# Patient Record
Sex: Female | Born: 1975 | Race: White | Hispanic: No | Marital: Married | Smoking: Never smoker
Health system: Southern US, Community
[De-identification: ages and names within clinical notes are randomized; demographics above are authoritative.]

## PROBLEM LIST (undated history)

## (undated) DIAGNOSIS — G932 Benign intracranial hypertension: Secondary | ICD-10-CM

## (undated) DIAGNOSIS — K219 Gastro-esophageal reflux disease without esophagitis: Secondary | ICD-10-CM

## (undated) DIAGNOSIS — I10 Essential (primary) hypertension: Secondary | ICD-10-CM

## (undated) DIAGNOSIS — H9319 Tinnitus, unspecified ear: Secondary | ICD-10-CM

## (undated) HISTORY — PX: POLYPECTOMY: SHX149

## (undated) HISTORY — DX: Benign intracranial hypertension: G93.2

## (undated) HISTORY — PX: TONSILLECTOMY: SUR1361

## (undated) HISTORY — DX: Tinnitus, unspecified ear: H93.19

---

## 2008-05-19 ENCOUNTER — Ambulatory Visit (HOSPITAL_COMMUNITY): Admission: RE | Admit: 2008-05-19 | Discharge: 2008-05-19 | Payer: Self-pay | Admitting: Obstetrics and Gynecology

## 2008-07-15 ENCOUNTER — Encounter: Payer: Self-pay | Admitting: Interventional Radiology

## 2008-10-16 ENCOUNTER — Encounter (INDEPENDENT_AMBULATORY_CARE_PROVIDER_SITE_OTHER): Payer: Self-pay | Admitting: Obstetrics and Gynecology

## 2008-10-16 ENCOUNTER — Ambulatory Visit (HOSPITAL_COMMUNITY): Admission: RE | Admit: 2008-10-16 | Discharge: 2008-10-16 | Payer: Self-pay | Admitting: Obstetrics and Gynecology

## 2008-11-03 ENCOUNTER — Ambulatory Visit (HOSPITAL_COMMUNITY): Admission: RE | Admit: 2008-11-03 | Discharge: 2008-11-03 | Payer: Self-pay | Admitting: Otolaryngology

## 2008-11-10 ENCOUNTER — Ambulatory Visit: Payer: Self-pay | Admitting: Oncology

## 2008-11-10 LAB — CMP (CANCER CENTER ONLY)
Albumin: 4.3 g/dL (ref 3.3–5.5)
Alkaline Phosphatase: 52 U/L (ref 26–84)
BUN, Bld: 17 mg/dL (ref 7–22)
Calcium: 10.2 mg/dL (ref 8.0–10.3)
Chloride: 100 mEq/L (ref 98–108)
Glucose, Bld: 111 mg/dL (ref 73–118)
Potassium: 3.7 mEq/L (ref 3.3–4.7)

## 2008-11-10 LAB — CBC WITH DIFFERENTIAL (CANCER CENTER ONLY)
EOS%: 5.5 % (ref 0.0–7.0)
MCH: 31.5 pg (ref 26.0–34.0)
MCHC: 34.3 g/dL (ref 32.0–36.0)
MONO%: 6.3 % (ref 0.0–13.0)
NEUT#: 2.8 10*3/uL (ref 1.5–6.5)
Platelets: 162 10*3/uL (ref 145–400)
RDW: 11.7 % (ref 10.5–14.6)

## 2008-11-10 LAB — MORPHOLOGY - CHCC SATELLITE

## 2008-11-14 LAB — PLATELET ANTIBODIES, DIRECT: Platelet IgG Ab, Direct: NEGATIVE

## 2008-11-14 LAB — PLT GLYCOPROTEIN (INDIRECT) AUTOABS
Plt Glyco IIb/IIIA Autoabs: DETECTED — AB
Plt Glycoprotein Ib/IX Autoabs: NOT DETECTED

## 2009-02-01 ENCOUNTER — Encounter: Admission: RE | Admit: 2009-02-01 | Discharge: 2009-02-01 | Payer: Self-pay | Admitting: Neurology

## 2009-05-05 ENCOUNTER — Ambulatory Visit: Payer: Self-pay | Admitting: Oncology

## 2009-07-02 ENCOUNTER — Ambulatory Visit: Payer: Self-pay | Admitting: Oncology

## 2009-07-07 LAB — CBC WITH DIFFERENTIAL (CANCER CENTER ONLY)
BASO#: 0 10*3/uL (ref 0.0–0.2)
Eosinophils Absolute: 0.2 10*3/uL (ref 0.0–0.5)
HGB: 13.5 g/dL (ref 11.6–15.9)
MCV: 93 fL (ref 81–101)
MONO#: 0.4 10*3/uL (ref 0.1–0.9)
NEUT#: 3.3 10*3/uL (ref 1.5–6.5)
Platelets: 127 10*3/uL — ABNORMAL LOW (ref 145–400)
RBC: 4.29 10*6/uL (ref 3.70–5.32)
WBC: 6.2 10*3/uL (ref 3.9–10.0)

## 2009-07-07 LAB — LIPID PANEL
Cholesterol: 180 mg/dL (ref 0–200)
Total CHOL/HDL Ratio: 3.5 Ratio
Triglycerides: 70 mg/dL (ref <150)
VLDL: 14 mg/dL (ref 0–40)

## 2010-05-13 LAB — BASIC METABOLIC PANEL
BUN: 11 mg/dL (ref 6–23)
Calcium: 9 mg/dL (ref 8.4–10.5)
Calcium: 9.6 mg/dL (ref 8.4–10.5)
Creatinine, Ser: 0.75 mg/dL (ref 0.4–1.2)
GFR calc non Af Amer: >60 mL/min (ref >60)
GFR calc non Af Amer: >60 mL/min (ref >60)
Glucose, Bld: 87 mg/dL (ref 70–99)
Sodium: 138 mEq/L (ref 135–145)

## 2010-05-13 LAB — CBC
HCT: 33 % — ABNORMAL LOW (ref 36.0–46.0)
Hemoglobin: 12.3 g/dL (ref 12.0–15.0)
MCHC: 35 g/dL (ref 30.0–36.0)
Platelets: 128 10*3/uL — ABNORMAL LOW (ref 150–400)
RDW: 12.8 % (ref 11.5–15.5)
RDW: 13.1 % (ref 11.5–15.5)

## 2010-05-13 LAB — PROTIME-INR
INR: 1 (ref 0.00–1.49)
Prothrombin Time: 13.1 seconds (ref 11.6–15.2)

## 2010-05-13 LAB — HCG, SERUM, QUALITATIVE: Preg, Serum: NEGATIVE

## 2011-12-13 ENCOUNTER — Other Ambulatory Visit: Payer: Self-pay | Admitting: Gastroenterology

## 2013-12-15 ENCOUNTER — Emergency Department (HOSPITAL_COMMUNITY): Payer: BC Managed Care – PPO

## 2013-12-15 ENCOUNTER — Encounter (HOSPITAL_COMMUNITY): Payer: Self-pay | Admitting: Emergency Medicine

## 2013-12-15 ENCOUNTER — Emergency Department (HOSPITAL_COMMUNITY)
Admission: EM | Admit: 2013-12-15 | Discharge: 2013-12-15 | Disposition: A | Discharge status: Home / Self Care | Payer: BC Managed Care – PPO | Attending: Emergency Medicine | Admitting: Emergency Medicine

## 2013-12-15 DIAGNOSIS — S299XXA Unspecified injury of thorax, initial encounter: Secondary | ICD-10-CM | POA: Diagnosis not present

## 2013-12-15 DIAGNOSIS — Y9389 Activity, other specified: Secondary | ICD-10-CM | POA: Insufficient documentation

## 2013-12-15 DIAGNOSIS — Z23 Encounter for immunization: Secondary | ICD-10-CM | POA: Insufficient documentation

## 2013-12-15 DIAGNOSIS — S8002XA Contusion of left knee, initial encounter: Secondary | ICD-10-CM

## 2013-12-15 DIAGNOSIS — Y998 Other external cause status: Secondary | ICD-10-CM | POA: Insufficient documentation

## 2013-12-15 DIAGNOSIS — Z880 Allergy status to penicillin: Secondary | ICD-10-CM | POA: Insufficient documentation

## 2013-12-15 DIAGNOSIS — S00512A Abrasion of oral cavity, initial encounter: Secondary | ICD-10-CM | POA: Diagnosis not present

## 2013-12-15 DIAGNOSIS — S8990XA Unspecified injury of unspecified lower leg, initial encounter: Secondary | ICD-10-CM | POA: Diagnosis present

## 2013-12-15 DIAGNOSIS — Y92410 Unspecified street and highway as the place of occurrence of the external cause: Secondary | ICD-10-CM | POA: Insufficient documentation

## 2013-12-15 DIAGNOSIS — R0789 Other chest pain: Secondary | ICD-10-CM

## 2013-12-15 MED ORDER — IBUPROFEN 800 MG PO TABS: 800.0000 mg | ORAL_TABLET | Freq: Three times a day (TID) | ORAL | Status: AC | PRN

## 2013-12-15 MED ORDER — TETANUS-DIPHTH-ACELL PERTUSSIS 5-2.5-18.5 LF-MCG/0.5 IM SUSP
0.5000 mL | Freq: Once | INTRAMUSCULAR | Status: AC
Start: 2013-12-15 — End: 2013-12-15
  Administered 2013-12-15: 0.5 mL via INTRAMUSCULAR
  Filled 2013-12-15: qty 0.5

## 2013-12-15 MED ORDER — CYCLOBENZAPRINE HCL 10 MG PO TABS: 10.0000 mg | ORAL_TABLET | Freq: Two times a day (BID) | ORAL | Status: DC | PRN

## 2013-12-15 MED ORDER — OXYCODONE-ACETAMINOPHEN 5-325 MG PO TABS: 1.0000 | ORAL_TABLET | ORAL | Status: DC | PRN

## 2013-12-15 MED ORDER — OXYCODONE-ACETAMINOPHEN 5-325 MG PO TABS
1.0000 | ORAL_TABLET | Freq: Once | ORAL | Status: AC
  Administered 2013-12-15: 1 via ORAL
  Filled 2013-12-15: qty 1
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
## 2013-12-15 NOTE — ED Notes (Signed)
Patient transported to X-ray 
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
## 2013-12-15 NOTE — ED Notes (Signed)
Swelling and bruising noted to L knee. Pt unsure what she hit it on. Pt also has bite marks on inside of lower lip and it appears swollen.
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
## 2013-12-15 NOTE — Discharge Instructions (Signed)
Read the information below.  Use the prescribed medication as directed.  Please discuss all new medications with your pharmacist.  Do not take additional tylenol while taking the prescribed pain medication to avoid overdose.  You may return to the Emergency Department at any time for worsening condition or any new symptoms that concern you.  If there is any possibility that you might be pregnant, please let your health care provider know and discuss this with the pharmacist to ensure medication safety.  If you develop worsening chest pain, shortness of breath, fever, you pass out, or become weak or dizzy, return to the ER for a recheck.

## 2014-04-29 ENCOUNTER — Other Ambulatory Visit: Payer: Self-pay | Admitting: Obstetrics and Gynecology

## 2014-04-30 LAB — CYTOLOGY - PAP

## 2014-07-27 ENCOUNTER — Ambulatory Visit
Admission: RE | Admit: 2014-07-27 | Discharge: 2014-07-27 | Disposition: A | Discharge status: Home / Self Care | Payer: BLUE CROSS/BLUE SHIELD | Source: Ambulatory Visit | Attending: Neurology | Admitting: Neurology

## 2014-07-27 ENCOUNTER — Other Ambulatory Visit: Payer: Self-pay | Admitting: Neurology

## 2014-07-27 DIAGNOSIS — M542 Cervicalgia: Secondary | ICD-10-CM

## 2015-11-03 ENCOUNTER — Other Ambulatory Visit: Payer: Self-pay | Admitting: Obstetrics and Gynecology

## 2015-11-03 DIAGNOSIS — R928 Other abnormal and inconclusive findings on diagnostic imaging of breast: Secondary | ICD-10-CM

## 2015-11-15 ENCOUNTER — Other Ambulatory Visit: Payer: Self-pay | Admitting: Obstetrics and Gynecology

## 2015-11-15 ENCOUNTER — Ambulatory Visit
Admission: RE | Admit: 2015-11-15 | Discharge: 2015-11-15 | Disposition: A | Discharge status: Home / Self Care | Payer: BLUE CROSS/BLUE SHIELD | Source: Ambulatory Visit | Attending: Obstetrics and Gynecology | Admitting: Obstetrics and Gynecology

## 2015-11-15 DIAGNOSIS — R928 Other abnormal and inconclusive findings on diagnostic imaging of breast: Secondary | ICD-10-CM

## 2015-11-16 ENCOUNTER — Other Ambulatory Visit: Payer: Self-pay | Admitting: Obstetrics and Gynecology

## 2015-11-16 DIAGNOSIS — R928 Other abnormal and inconclusive findings on diagnostic imaging of breast: Secondary | ICD-10-CM

## 2015-11-18 ENCOUNTER — Other Ambulatory Visit: Payer: Self-pay | Admitting: Obstetrics and Gynecology

## 2015-11-18 ENCOUNTER — Ambulatory Visit
Admission: RE | Admit: 2015-11-18 | Discharge: 2015-11-18 | Disposition: A | Discharge status: Home / Self Care | Payer: BLUE CROSS/BLUE SHIELD | Source: Ambulatory Visit | Attending: Obstetrics and Gynecology | Admitting: Obstetrics and Gynecology

## 2015-11-18 DIAGNOSIS — R928 Other abnormal and inconclusive findings on diagnostic imaging of breast: Secondary | ICD-10-CM

## 2015-11-18 HISTORY — PX: BREAST BIOPSY: SHX20
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
## 2019-01-07 ENCOUNTER — Ambulatory Visit
Admission: EM | Admit: 2019-01-07 | Discharge: 2019-01-07 | Disposition: A | Discharge status: Home / Self Care | Payer: BC Managed Care – PPO | Attending: Emergency Medicine | Admitting: Emergency Medicine

## 2019-01-07 DIAGNOSIS — R21 Rash and other nonspecific skin eruption: Secondary | ICD-10-CM | POA: Diagnosis not present

## 2019-01-07 DIAGNOSIS — N10 Acute tubulo-interstitial nephritis: Secondary | ICD-10-CM | POA: Diagnosis present

## 2019-01-07 LAB — POCT URINALYSIS DIP (MANUAL ENTRY)
Glucose, UA: 100 mg/dL — AB
Nitrite, UA: NEGATIVE
Protein Ur, POC: 100 mg/dL — AB
Spec Grav, UA: >=1.03 — AB (ref 1.010–1.025)
Urobilinogen, UA: 1 E.U./dL
pH, UA: 5.5 (ref 5.0–8.0)

## 2019-01-07 MED ORDER — PREDNISONE 10 MG (21) PO TBPK: ORAL_TABLET | Freq: Every day | ORAL | 0 refills | Status: DC

## 2019-01-07 MED ORDER — CIPROFLOXACIN HCL 500 MG PO TABS: 500.0000 mg | ORAL_TABLET | Freq: Two times a day (BID) | ORAL | 0 refills | Status: DC

## 2019-01-07 NOTE — ED Provider Notes (Signed)
[facility]    CSN: [id number] Arrival date & time: 01/07/19  0848      History   Chief Complaint Chief Complaint  Patient presents with  . Urinary Tract Infection  . Rash    HPI Lauren Mullins is a 43 y.o. female resenting for UTI, rash.  States she developed UTI symptoms such as frequency, burning with urination, "bladder spasms " x 10 days ago.  Was evaluated for this by her PCP: Given Macrobid.  States symptoms have persisted, now having left low back pain.  States Azo does help: Last dose 2 days ago.  Patient denies history of recurrent UTI, pyelonephritis, renal calculi.  Patient denies pelvic, abdominal pain, fever.  No vaginal discharge, concern for STD at this time. Patient also noticed rash that has been "hopping around "for the last 4 days.  Reports that it is burning/itching, red, warm.  Patient voices concern that it may be drug reaction, though denies diffuse dermal involvement.  Patient does state that she died her daughter is here the other day, does tend to get adverse reactions to dye: No other known possible triggers.  Denies cough, shortness of breath.    History reviewed. No pertinent past medical history.  There are no active problems to display for this patient.   Past Surgical History:  Procedure Laterality Date  . CESAREAN SECTION    . POLYPECTOMY    . TONSILLECTOMY      OB History   No obstetric history on file.      Home Medications    Prior to Admission medications   Medication Sig Start Date End Date Taking? Authorizing Provider  esomeprazole (NEXIUM) 10 MG packet Take 10 mg by mouth daily before breakfast.   Yes [provider]  fexofenadine (ALLEGRA) 60 MG tablet Take 60 mg by mouth 2 (two) times daily.   Yes [provider]  ciprofloxacin (CIPRO) 500 MG tablet Take 1 tablet (500 mg total) by mouth 2 (two) times daily. 01/07/19   Hall-Potvin, [place], PA-C  ibuprofen (ADVIL,MOTRIN) 800 MG tablet Take 1 tablet  (800 mg total) by mouth every 8 (eight) hours as needed for mild pain or moderate pain. 12/15/13   Trixie DredgeWest, Emily, PA-C  predniSONE (STERAPRED UNI-PAK 21 TAB) 10 MG (21) TBPK tablet Take by mouth daily. Take steroid taper as written 01/07/19   Hall-Potvin, [place], PA-C    Family History Family History  Problem Relation Age of Onset  . Healthy Mother   . Cancer Father   . COPD Father     Social History Social History   Tobacco Use  . Smoking status: Never Smoker  . Smokeless tobacco: Never Used  Substance Use Topics  . Alcohol use: Yes  . Drug use: No     Allergies   Penicillins and Sulfa antibiotics   Review of Systems Review of Systems  Constitutional: Negative for fatigue and fever.  Respiratory: Negative for cough and shortness of breath.   Cardiovascular: Negative for chest pain and palpitations.  Gastrointestinal: Negative for constipation and diarrhea.  Genitourinary: Positive for dysuria, frequency and urgency. Negative for flank pain, hematuria, pelvic pain, vaginal bleeding, vaginal discharge and vaginal pain.  Skin: Positive for rash. Negative for wound.     Physical Exam Triage Vital Signs ED Triage Vitals [01/07/19 0856]  Enc Vitals Group     BP (!) 169/94     Pulse Rate 79     Resp 16     Temp 98.4 F (  36.9 C)     Temp Source Oral     SpO2 99 %     Weight      Height      Head Circumference      Peak Flow      Pain Score      Pain Loc      Pain Edu?      Excl. in [place]?    No data found.  Updated Vital Signs BP (!) 169/94 (BP Location: Left Arm)   Pulse 79   Temp 98.4 F (36.9 C) (Oral)   Resp 16   LMP  (LMP Unknown)   SpO2 99%   Visual Acuity Right Eye Distance:   Left Eye Distance:   Bilateral Distance:    Right Eye Near:   Left Eye Near:    Bilateral Near:     Physical Exam Constitutional:      General: She is not in acute distress.    Appearance: She is not ill-appearing.  HENT:     Head: Normocephalic and atraumatic.   Eyes:     General: No scleral icterus.    Pupils: Pupils are equal, round, and reactive to light.  Cardiovascular:     Rate and Rhythm: Normal rate.  Pulmonary:     Effort: Pulmonary effort is normal. No respiratory distress.     Breath sounds: No wheezing.  Abdominal:     General: Bowel sounds are normal.     Palpations: Abdomen is soft.     Tenderness: There is no abdominal tenderness. There is no right CVA tenderness, left CVA tenderness or guarding.  Skin:    Coloration: Skin is not jaundiced or pale.     Findings: Rash present.     Comments: Diffuse, irregular areas of nonraised erythema, warmth over right side of neck, distal upper extremities bilaterally, right forehead.  No open wound, pain.  Neurological:     Mental Status: She is alert and oriented to person, place, and time.      UC Treatments / Results  Labs (all labs ordered are listed, but only abnormal results are displayed) Labs Reviewed  POCT URINALYSIS DIP (MANUAL ENTRY) - Abnormal; Notable for the following components:      Result Value   Color, UA red (*)    Clarity, UA cloudy (*)    Glucose, UA =100 (*)    Bilirubin, UA small (*)    Ketones, POC UA trace (5) (*)    Spec Grav, UA >=1.030 (*)    Blood, UA large (*)    Protein Ur, POC =100 (*)    Leukocytes, UA Small (1+) (*)    All other components within normal limits  URINE CULTURE    EKG   Radiology No results found.  Procedures Procedures (including critical care time)  Medications Ordered in UC Medications - No data to display  Initial Impression / Assessment and Plan / UC Course  I have reviewed the triage vital signs and the nursing notes.  Pertinent labs & imaging results that were available during my care of the patient were reviewed by me and considered in my medical decision making (see chart for details).     Patient afebrile, nontoxic.  POC urine dipstick done office, reviewed by me: Positive for leukocytes, urobilinogen,  protein, large blood, trace ketones, small bilirubin - Culture pending given likely Macrobid resistant UTI, no concern for pyelonephritis.  Will start ciprofloxacin. Will start prednisone taper for rash: Patient to go home  wash all clothing, bedding, linens.  Return precautions discussed, patient verbalized understanding and is agreeable to plan. Final Clinical Impressions(s) / UC Diagnoses   Final diagnoses:  Rash  Acute pyelonephritis     Discharge Instructions     Take antibiotic as directed. Try taking with food to avoid adverse reaction such as GI upset, diarrhea, nausea. Take steroid taper as discussed: Follow-up with PCP for persistent, worsening rash. Go to ER if you develop throat/lip tightening, shortness of breath.    ED Prescriptions    Medication Sig Dispense Auth. Provider   ciprofloxacin (CIPRO) 500 MG tablet Take 1 tablet (500 mg total) by mouth 2 (two) times daily. 14 tablet Hall-Potvin, [place], PA-C   predniSONE (STERAPRED UNI-PAK 21 TAB) 10 MG (21) TBPK tablet Take by mouth daily. Take steroid taper as written 21 tablet Hall-Potvin, [place], PA-C     PDMP not reviewed this encounter.   Odette Fraction Boyd, [place] 01/07/19 [phone]

## 2019-01-07 NOTE — ED Triage Notes (Signed)
Pt presents with complaints of on and off rash all over. Patient also reports urinary frequency and hematruia  X 10 days.

## 2019-01-07 NOTE — Discharge Instructions (Signed)
Take antibiotic as directed. Try taking with food to avoid adverse reaction such as GI upset, diarrhea, nausea. Take steroid taper as discussed: Follow-up with PCP for persistent, worsening rash. Go to ER if you develop throat/lip tightening, shortness of breath.

## 2019-01-09 LAB — URINE CULTURE: Culture: >=100000 — AB

## 2019-05-26 ENCOUNTER — Other Ambulatory Visit: Payer: Self-pay | Admitting: Obstetrics and Gynecology

## 2019-05-26 DIAGNOSIS — R928 Other abnormal and inconclusive findings on diagnostic imaging of breast: Secondary | ICD-10-CM

## 2019-06-05 ENCOUNTER — Ambulatory Visit
Admission: RE | Admit: 2019-06-05 | Discharge: 2019-06-05 | Disposition: A | Discharge status: Home / Self Care | Payer: 59 | Source: Ambulatory Visit | Attending: Obstetrics and Gynecology | Admitting: Obstetrics and Gynecology

## 2019-06-05 ENCOUNTER — Other Ambulatory Visit: Payer: Self-pay

## 2019-06-05 ENCOUNTER — Ambulatory Visit
Admission: RE | Admit: 2019-06-05 | Discharge: 2019-06-05 | Disposition: A | Discharge status: Home / Self Care | Payer: BC Managed Care – PPO | Source: Ambulatory Visit | Attending: Obstetrics and Gynecology | Admitting: Obstetrics and Gynecology

## 2019-06-05 ENCOUNTER — Other Ambulatory Visit: Payer: Self-pay | Admitting: Obstetrics and Gynecology

## 2019-06-05 DIAGNOSIS — R928 Other abnormal and inconclusive findings on diagnostic imaging of breast: Secondary | ICD-10-CM

## 2019-06-05 DIAGNOSIS — N6489 Other specified disorders of breast: Secondary | ICD-10-CM

## 2019-06-11 ENCOUNTER — Inpatient Hospital Stay: Admission: RE | Admit: 2019-06-11 | Payer: 59 | Source: Ambulatory Visit

## 2019-06-13 ENCOUNTER — Other Ambulatory Visit: Payer: Self-pay

## 2019-06-13 ENCOUNTER — Ambulatory Visit
Admission: RE | Admit: 2019-06-13 | Discharge: 2019-06-13 | Disposition: A | Discharge status: Home / Self Care | Payer: 59 | Source: Ambulatory Visit | Attending: Obstetrics and Gynecology | Admitting: Obstetrics and Gynecology

## 2019-06-13 DIAGNOSIS — N6489 Other specified disorders of breast: Secondary | ICD-10-CM

## 2019-06-13 HISTORY — PX: BREAST BIOPSY: SHX20

## 2020-11-16 ENCOUNTER — Other Ambulatory Visit: Payer: Self-pay | Admitting: Obstetrics and Gynecology

## 2020-11-16 DIAGNOSIS — Z09 Encounter for follow-up examination after completed treatment for conditions other than malignant neoplasm: Secondary | ICD-10-CM

## 2020-11-16 DIAGNOSIS — N6012 Diffuse cystic mastopathy of left breast: Secondary | ICD-10-CM

## 2020-11-30 ENCOUNTER — Other Ambulatory Visit: Payer: Self-pay | Admitting: Obstetrics and Gynecology

## 2020-11-30 DIAGNOSIS — Z09 Encounter for follow-up examination after completed treatment for conditions other than malignant neoplasm: Secondary | ICD-10-CM

## 2020-11-30 DIAGNOSIS — N6489 Other specified disorders of breast: Secondary | ICD-10-CM

## 2020-12-03 ENCOUNTER — Ambulatory Visit
Admission: RE | Admit: 2020-12-03 | Discharge: 2020-12-03 | Disposition: A | Discharge status: Home / Self Care | Payer: 59 | Source: Ambulatory Visit | Attending: Obstetrics and Gynecology | Admitting: Obstetrics and Gynecology

## 2020-12-03 DIAGNOSIS — N6489 Other specified disorders of breast: Secondary | ICD-10-CM

## 2020-12-03 DIAGNOSIS — Z09 Encounter for follow-up examination after completed treatment for conditions other than malignant neoplasm: Secondary | ICD-10-CM

## 2021-03-02 ENCOUNTER — Encounter: Payer: Self-pay | Admitting: *Deleted

## 2021-03-02 ENCOUNTER — Other Ambulatory Visit: Payer: Self-pay | Admitting: *Deleted

## 2021-03-03 ENCOUNTER — Ambulatory Visit (INDEPENDENT_AMBULATORY_CARE_PROVIDER_SITE_OTHER): Payer: 59 | Admitting: Psychiatry

## 2021-03-03 ENCOUNTER — Telehealth: Payer: Self-pay | Admitting: Psychiatry

## 2021-03-03 ENCOUNTER — Encounter: Payer: Self-pay | Admitting: Psychiatry

## 2021-03-03 VITALS — BP 118/78 | HR 96 | Ht 67.5 in | Wt 171.2 lb

## 2021-03-03 DIAGNOSIS — G932 Benign intracranial hypertension: Secondary | ICD-10-CM | POA: Diagnosis not present

## 2021-03-03 MED ORDER — TOPIRAMATE 25 MG PO TABS: ORAL_TABLET | ORAL | 3 refills | Status: DC

## 2021-03-03 MED ORDER — NURTEC 75 MG PO TBDP: 75.0000 mg | ORAL_TABLET | ORAL | 3 refills | Status: DC | PRN

## 2021-03-03 NOTE — Telephone Encounter (Signed)
Sent to Dr. Groat ph # [phone] 

## 2021-03-03 NOTE — Progress Notes (Signed)
Referring:  Merri Brunette, MD [address] [place],  [place] [address]  PCP: Isabella Bowens, PA-C  Neurology was asked to evaluate [facility], a 46 year old female for a chief complaint of headaches.  Our recommendations of care will be communicated by shared medical record.    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 They are described as occipital pressure with associated blurred vision, photophobia, phonophobia, nausea, and vomiting. She currently has one headache per week. Takes Excedrin migraine as needed which does typically help reduce her headache.  The patient was diagnosed with IIH in 2010. At that time she developed headaches and pulsatile tinnitus. States she had an LP with elevated opening pressure at that time. She took Diamox and Dyazide and symptoms resolved.   Saw her PCP for her headaches last Friday and he restarted Dyazide. She thinks it may have helped reduce her headaches a little bit.  Ophthalmology exam last year was normal without papilledema.  Headache History: Onset: 2 months ago Triggers: lack of sleep, storm, coughing, sneezing Aura: blurred vision Location: occipital, down neck and shoulders Quality/Description: pressure Associated Symptoms:  Photophobia: yes  Phonophobia: yes  Nausea: yes Vomiting: yes Worse with activity?: yes Duration of headaches: up to 18 hours  Headache days per month: 4 Headache free days per month: 26  Current Treatment: Abortive Excedrin  Preventative Dyazide 50 mg daily  Prior Therapies                                 Diamox - cramping Dyazide 50 mg daily Ibuprofen Excedrin - heart racing Naratriptan - side effects Imitrex - side effects  Headache Risk Factors: Headache risk factors and/or co-morbidities (+) Neck Pain (-) Obesity   Body mass index is 26.42 kg/m. (+) History of Traumatic Brain Injury and/or Concussion  LABS: CBC    Component Value Date/Time   WBC 6.2 07/07/2009 1144   WBC 5.3 11/03/2008 0829   RBC 4.29 07/07/2009 1144   RBC 3.61 (L) 11/03/2008 0829   HGB 13.5 07/07/2009 1144   HCT 39.9 07/07/2009 1144   PLT 127 (L) 07/07/2009 1144   MCV 93 07/07/2009 1144   MCH 31.5 07/07/2009 1144   MCHC 33.9 07/07/2009 1144   MCHC 35.6 11/03/2008 0829   RDW 11.6 07/07/2009 1144   LYMPHSABS 2.3 07/07/2009 1144   EOSABS 0.2 07/07/2009 1144   BASOSABS 0.0 07/07/2009 1144   CMP Latest Ref Rng & Units 11/10/2008 11/03/2008 10/16/2008  Glucose 73 - 118 mg/dL 628 366(Q) 87  BUN 7 - 22 mg/dL 17 11 13   Creatinine 0.6 - 1.2 mg/dl 0.8 9.47  Sodium 6.54 - 145 mEq/L 138 138 138  Potassium 3.3 - 4.7 mEq/L 3.7 3.6 3.2(L)  Chloride 98 - 108 mEq/L 100 101 105  CO2 18 - 33 mEq/L 31 30 27   Calcium 8.0 - 10.3 mg/dL 650 9.0 9.6  Total Protein 6.4 - 8.1 g/dL 8.3(H) - -  Total Bilirubin 0.20 - 1.60 mg/dl - -  Alkaline Phos 26 - 84 U/L 52 - -  AST 11 - 38 U/L 25 - -  ALT 10 - 47 U/L 25 - -     IMAGING:  MRI brain 2010: 1. Attenuation of some intracranial veins as  described on the prior  cerebral angiogram, see MRV below.  In conjunction with ectasia of the optic nerve root sheaths, the appearance can be seen in the  setting of idiopathic intracranial hypertension. Clinical  correlation recommended.  2.  Otherwise, normal MRI appearance of the brain.   MRV/MRA brain 2010: 1.  No venous sinus thrombosis.  Attenuated appearance of the  transverse sinuses could be explained by a prominent occipital  sinus anatomic variant which communicates with the right internal  jugular bulb.  2.  Negative intracranial MRA.  Incidental diminutive basilar  artery due to bilateral fetal PCA origins.   Imaging independently reviewed on March 03, 2021   Current Outpatient Medications on File Prior to Visit  Medication  Sig Dispense Refill   aspirin-acetaminophen-caffeine (EXCEDRIN MIGRAINE) 250-250-65 MG tablet as needed.     Cyanocobalamin 1000 MCG CAPS See admin instructions.     esomeprazole (NEXIUM) 10 MG packet Take 10 mg by mouth daily before breakfast.     ibuprofen (ADVIL,MOTRIN) 800 MG tablet Take 1 tablet (800 mg total) by mouth every 8 (eight) hours as needed for mild pain or moderate pain. 15 tablet 0   Magnesium 200 MG TABS 2 tablets with a meal     Triamterene-HCTZ (DYAZIDE PO) Take 50 mg by mouth daily.     No current facility-administered medications on file prior to visit.     Allergies: Allergies  Allergen Reactions   Macrobid [Nitrofurantoin] Hives    Other reaction(s): rash   Penicillins     rash   Sulfa Antibiotics     Family History: Migraine or other headaches in the family:  no Aneurysms in a first degree relative:  no Brain tumors in the family:  no Other neurological illness in the family:   no  Past Medical History: Past Medical History:  Diagnosis Date   Intracranial hypertension    Tinnitus     Past Surgical History Past Surgical History:  Procedure Laterality Date   BREAST BIOPSY Left 06/13/2019   BREAST BIOPSY Left 11/18/2015   CESAREAN SECTION     POLYPECTOMY     TONSILLECTOMY      Social History: Social History   Tobacco Use   Smoking status: Never   Smokeless tobacco: Never  Substance Use Topics   Alcohol use: Yes    Comment: occas   Drug use: No    ROS: Negative for fevers, chills. Positive for headaches, blurred vision. All other systems reviewed and negative unless stated otherwise in HPI.   Physical Exam:   Vital Signs: BP 118/78    Pulse 96    Ht 5' 7.5" (1.715 m)    Wt 171 lb 3.2 oz (77.7 kg)    BMI 26.42 kg/m  GENERAL: well appearing,in no acute distress,alert SKIN:  Color, texture, turgor normal. No rashes or lesions HEAD:  Normocephalic/atraumatic. CV:  RRR RESP: Normal respiratory effort MSK: +mild tenderness to  palpation over bilateral occiput, neck, and shoulders  NEUROLOGICAL: Mental Status: Alert, oriented to person, place and time,Follows commands Cranial Nerves: PERRL,optic disc sharp OD, ?mild blurring of disc margins OS, visual fields intact to confrontation,extraocular movements intact,facial sensation intact,no facial droop or ptosis,hearing intact to finger rub bilaterally,no dysarthria Motor: muscle strength 5/5 both upper and lower extremities,no drift, normal tone Reflexes: 2+ throughout Sensation: intact to light touch all 4 extremities Coordination: Finger-to- nose-finger intact bilaterally,Heel-to-shin intact bilaterally Gait: normal-based   IMPRESSION: 46 year old female with a history of hiatal hernia, IIH (dx  2010 via LP), iron deficiency anemia who presents for evaluation of worsening headaches. Fundoscopic exam reveals possible subtle blurring of OS disc margin. Will refer to ophthalmology for a formal fundus exam to assess for papilledema. In the meantime will switch Dyazide to Topamax as this may be more effective for the migrainous components of her headaches. Will start Nurtec as needed for her more migrainous headaches.  PLAN: -Prevention: Stop Dyazide, start Topamax 25 mg once daily; increase dose by 25 mg each week as tolerated to 100 mg QHS -Rescue: Start Nurtec 75 mg PRN -Referral to Ophthalmology -next steps: MRI/MRV and repeat LP if papilledema found on ophthalmology exam  I spent a total of 40 minutes chart reviewing and counseling the patient.  Discussed medication side effects, adverse reactions and drug interactions. Written educational materials and patient instructions outlining all of the above were given.  Follow-up: 3 months   Ocie Doyne, MD 03/03/2021   3:02 PM

## 2021-03-03 NOTE — Patient Instructions (Addendum)
Start Topamax for headache prevention. Take 25 mg (1 pill) at bedtime for one week, then increase to 50 mg (2 pills) at bedtime for one week, then take 75 mg (3 pills) at bedtime for one week, then take 100 mg (4 pills) at bedtime Take Nurtec as needed for headaches. Take one pill at the first sign of headache, may repeat a dose in 24 hours if headache persists. Referral to ophthalmology

## 2021-03-07 ENCOUNTER — Telehealth: Payer: Self-pay

## 2021-03-07 NOTE — Telephone Encounter (Signed)
Nurtec approved , 8 per month through 03/07/2022. Approval letter faxed to pharmacy.

## 2021-03-07 NOTE — Telephone Encounter (Signed)
PA for nurtec has been verbally submitted through [facility]. Ref #- [id number]. Additional clincial information is needed and has been faxe to # [phone], confirmation has been received.  PA determination should be made within 48-72 business hours.

## 2021-03-29 ENCOUNTER — Encounter: Payer: Self-pay | Admitting: Psychiatry

## 2021-06-02 ENCOUNTER — Ambulatory Visit: Payer: 59 | Admitting: Psychiatry

## 2021-09-26 ENCOUNTER — Encounter: Payer: Self-pay | Admitting: Psychiatry

## 2021-09-27 NOTE — Telephone Encounter (Signed)
Please have her schedule a follow up with an NP to discuss medication options. Thanks!

## 2021-09-29 ENCOUNTER — Ambulatory Visit (INDEPENDENT_AMBULATORY_CARE_PROVIDER_SITE_OTHER): Payer: 59 | Admitting: Family Medicine

## 2021-09-29 ENCOUNTER — Encounter: Payer: Self-pay | Admitting: Family Medicine

## 2021-09-29 VITALS — BP 139/99 | HR 86 | Ht 67.5 in | Wt 181.0 lb

## 2021-09-29 DIAGNOSIS — R519 Headache, unspecified: Secondary | ICD-10-CM

## 2021-09-29 DIAGNOSIS — G8929 Other chronic pain: Secondary | ICD-10-CM

## 2021-09-29 DIAGNOSIS — Z8669 Personal history of other diseases of the nervous system and sense organs: Secondary | ICD-10-CM

## 2021-09-29 MED ORDER — NURTEC 75 MG PO TBDP: 75.0000 mg | ORAL_TABLET | ORAL | 3 refills | Status: DC | PRN

## 2021-09-29 NOTE — Patient Instructions (Signed)
Below is our plan:  We will try Nurtec for abortive therapy. We will discuss case with Dr Delena Bali. May consider repeat LP and or MRV. Will consider acetazolimide for treatment pending discussion.   Please make sure you are staying well hydrated. I recommend 50-60 ounces daily. Well balanced diet and regular exercise encouraged. Consistent sleep schedule with 6-8 hours recommended.   Please continue follow up with care team as directed.   Follow up with me in 4-6  You may receive a survey regarding today's visit. I encourage you to leave honest feed back as I do use this information to improve patient care. Thank you for seeing me today!

## 2021-09-29 NOTE — Progress Notes (Signed)
Chief Complaint  Patient presents with   Rm 18    Here  for headache follow-up. Wakes up with daily headaches.They have been more frequent since she saw Dr Billey Gosling. Topiramate gave her terrible side effects and she stopped it. Her insurance wouldn't cover Nurtec. She uses Excedrin migraine.    HISTORY OF PRESENT ILLNESS:  09/29/21 ALL:  Lauren Mullins is a 46 y.o. female here today for follow up for IIH and migraines. She was seen in consult with Dr Billey Gosling 02/2021. She had recently been restarted on Dyazide by PCP. Dr Billey Gosling advised discontinuation and started topiramate. Per ophthalmology notes reviewed by Dr Billey Gosling, no papilledema but she was not tolerating topiramate and advised to taper off. She reports daily headaches. She reports pain starts in the back of her head. It usually radiates to bilateral shoulders and wraps around the top of head. She has pulsating tinnitus with severe headaches. She reports migrainous symptoms about twice a month. She is endorses nausea, sound and light sensitivity. She uses Excedrin for abortive therapy, usually once a week, but recently using daily.   She was seen by ophthalmology, yesterday, and told optic nerve pressures were normal. Last LP was in 2010 and she reports elevated reading but unsure of specific result.   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 They are described as occipital pressure with associated blurred vision, photophobia, phonophobia, nausea, and vomiting. She currently has one headache per week. Takes Excedrin migraine as needed which does typically help reduce her headache.   The patient was diagnosed with IIH in 2010. At that time she developed headaches and pulsatile tinnitus. States she had an LP with elevated opening pressure at that time. She took  Diamox and Dyazide and symptoms resolved.    Saw her PCP for her headaches last Friday and he restarted Dyazide. She thinks it may have helped reduce her headaches a little bit.   Ophthalmology exam last year was normal without papilledema.   Headache History: Onset: 2 months ago Triggers: lack of sleep, storm, coughing, sneezing Aura: blurred vision Location: occipital, down neck and shoulders Quality/Description: pressure Associated Symptoms:             Photophobia: yes             Phonophobia: yes             Nausea: yes Vomiting: yes Worse with activity?: yes Duration of headaches: up to 18 hours   Headache days per month: 4 Headache free days per month: 26   Current Treatment: Abortive Excedrin   Preventative Dyazide 50 mg daily   Prior Therapies                                 Diamox - cramping Dyazide 50 mg daily Ibuprofen Excedrin - heart racing Naratriptan - side effects Imitrex - side effects   Headache Risk Factors: Headache risk factors and/or co-morbidities (+) Neck Pain (-) Obesity  Body mass index is 26.42 kg/m. (+) History of Traumatic Brain Injury and/or Concussion   REVIEW OF SYSTEMS: Out of a complete 14 system review of symptoms, the patient complains only of the following symptoms, headaches, neck pain and all other reviewed systems are negative.   ALLERGIES: Allergies  Allergen Reactions   Macrobid [Nitrofurantoin] Hives    Other reaction(s): rash   Penicillins     rash   Sulfa Antibiotics      HOME MEDICATIONS: Outpatient Medications Prior to Visit  Medication Sig Dispense Refill   aspirin-acetaminophen-caffeine (EXCEDRIN MIGRAINE) 250-250-65 MG tablet as needed.     Cyanocobalamin 1000 MCG CAPS See admin instructions.     esomeprazole (NEXIUM) 10 MG packet Take 10 mg by mouth daily before breakfast.     ibuprofen (ADVIL,MOTRIN) 800 MG tablet Take 1 tablet (800 mg total) by mouth every 8 (eight) hours as needed for mild pain or  moderate pain. 15 tablet 0   Magnesium 200 MG TABS 2 tablets with a meal     Rimegepant Sulfate (NURTEC) 75 MG TBDP Take 75 mg by mouth as needed. (Patient not taking: Reported on 09/29/2021) 8 tablet 3   topiramate (TOPAMAX) 25 MG tablet Take 25 mg (1 pill) at bedtime for one week, then increase to 50 mg (2 pills) at bedtime for one week, then take 75 mg (3 pills) at bedtime for one week, then take 100 mg (4 pills) at bedtime (Patient not taking: Reported on 09/29/2021) 120 tablet 3   Triamterene-HCTZ (DYAZIDE PO) Take 50 mg by mouth daily. (Patient not taking: Reported on 09/29/2021)     No facility-administered medications prior to visit.     PAST MEDICAL HISTORY: Past Medical History:  Diagnosis Date   Intracranial hypertension    Tinnitus      PAST SURGICAL HISTORY: Past Surgical History:  Procedure Laterality Date   BREAST BIOPSY Left 06/13/2019   BREAST BIOPSY Left 11/18/2015   CESAREAN SECTION     POLYPECTOMY     TONSILLECTOMY       FAMILY HISTORY: Family History  Problem Relation Age of Onset   Healthy Mother    Cancer Father    COPD Father    Breast cancer Maternal Aunt      SOCIAL HISTORY: Social History   Socioeconomic History   Marital status: Married    Spouse name: Richardson Landry   Number of children: 3   Years of education: Not on file   Highest education level: Some college, no degree  Occupational History    Comment: Glass blower/designer, [employer]  Tobacco Use   Smoking status: Never   Smokeless tobacco: Never  Substance and Sexual Activity   Alcohol use: Yes    Comment: occas   Drug use: No   Sexual activity: Not on file  Other Topics Concern   Not on file  Social History Narrative   Lives with family   Caffeine- coffee 1 c, soda 8 oz daily   Social Determinants of Health   Financial Resource Strain: Not on file  Food Insecurity: Not on file  Transportation Needs: Not on file  Physical Activity: Not on file  Stress: Not on file   Social Connections: Not on file  Intimate Partner Violence: Not on file     PHYSICAL EXAM  Vitals:   09/29/21 1447  BP: (!) 139/99  Pulse: 86  Weight: 181 lb (82.1 kg)  Height: 5' 7.5" (1.715 m)   Body mass index is 27.93 kg/m.  Generalized: Well developed, in no acute distress  Cardiology: normal rate and rhythm, no murmur auscultated  Respiratory: clear to auscultation bilaterally    Neurological examination  Mentation: Alert oriented to time, place, history taking. Follows all commands speech and language fluent Cranial nerve II-XII: Pupils were equal  round reactive to light. Extraocular movements were full, visual field were full on confrontational test. Facial sensation and strength were normal. Uvula tongue midline. Head turning and shoulder shrug  were normal and symmetric. Motor: The motor testing reveals 5 over 5 strength of all 4 extremities. Good symmetric motor tone is noted throughout.  Sensory: Sensory testing is intact to soft touch on all 4 extremities. No evidence of extinction is noted.  Coordination: Cerebellar testing reveals good finger-nose-finger and heel-to-shin bilaterally.  Gait and station: Gait is normal. Tandem gait is normal. Romberg is negative. No drift is seen.  Reflexes: Deep tendon reflexes are symmetric and normal bilaterally.    DIAGNOSTIC DATA (LABS, IMAGING, TESTING) - I reviewed patient records, labs, notes, testing and imaging myself where available.  Lab Results  Component Value Date   WBC 6.2 07/07/2009   HGB 13.5 07/07/2009   HCT 39.9 07/07/2009   MCV 93 07/07/2009   PLT 127 (L) 07/07/2009      Component Value Date/Time   NA 138 11/10/2008 1027   K 3.7 11/10/2008 1027   CL 100 11/10/2008 1027   CO2 31 11/10/2008 1027   GLUCOSE 111 11/10/2008 1027   BUN 17 11/10/2008 1027   CREATININE 0.8 11/10/2008 1027   CALCIUM 10.2 11/10/2008 1027   PROT 8.3 (H) 11/10/2008 1027   ALBUMIN 4.3 11/10/2008 1027   AST 25 11/10/2008  1027   ALT 25 11/10/2008 1027   ALKPHOS 52 11/10/2008 1027   BILITOT 1.20 11/10/2008 1027   GFRNONAA >60 11/03/2008 0829   GFRAA  11/03/2008 0829    >60        The eGFR has been calculated using the MDRD equation. This calculation has not been validated in all clinical situations. eGFR's persistently <60 mL/min signify possible Chronic Kidney Disease.   Lab Results  Component Value Date   CHOL 180 07/07/2009   HDL 51 07/07/2009   LDLCALC 115 (H) 07/07/2009   TRIG 70 07/07/2009   CHOLHDL 3.5 07/07/2009   No results found for: "HGBA1C" No results found for: "VITAMINB12" No results found for: "TSH"      No data to display               No data to display           ASSESSMENT AND PLAN  46 y.o. year old female  has a past medical history of Intracranial hypertension and Tinnitus. here with    Chronic intractable headache, unspecified headache type  History of idiopathic intracranial hypertension  Genesys Nannie Starzyk reports headaches continue on near daily basis. She has failed topiramate and Dyazide. We have discussed repeat imaging versus repeat LP. She was previously managed with acetazolamide and wishes to consider restarting a low dose to see if this helps. I will discuss with Dr Billey Gosling in the absence of papilledema. I will resubmit Nurtec rx. She will try taking 21m daily as needed. Advised to try to avoid regular use of Excedrin. Healthy lifestyle habits encouraged. She will follow up with PCP as directed. She will return to see me in 6 months, sooner if needed. She verbalizes understanding and agreement with this plan.   No orders of the defined types were placed in this encounter.    Meds ordered this encounter  Medications   Rimegepant Sulfate (NURTEC) 75 MG TBDP    Sig: Take 75 mg by mouth as needed.    Dispense:  8 tablet    Refill:  3  Order Specific Question:   Supervising Provider    Answer:   Melvenia Beam [[id number]]     Debbora Presto,  MSN, FNP-C 09/29/2021, 3:36 PM  [facility] [address], [place], [place] [address] [phone]

## 2021-10-03 ENCOUNTER — Encounter: Payer: Self-pay | Admitting: Family Medicine

## 2021-10-04 ENCOUNTER — Other Ambulatory Visit: Payer: Self-pay | Admitting: Family Medicine

## 2021-10-04 MED ORDER — ACETAZOLAMIDE 250 MG PO TABS: 250.0000 mg | ORAL_TABLET | Freq: Two times a day (BID) | ORAL | 1 refills | Status: DC

## 2021-11-25 ENCOUNTER — Other Ambulatory Visit: Payer: Self-pay | Admitting: Obstetrics and Gynecology

## 2021-11-25 DIAGNOSIS — Z139 Encounter for screening, unspecified: Secondary | ICD-10-CM

## 2021-12-05 ENCOUNTER — Ambulatory Visit (INDEPENDENT_AMBULATORY_CARE_PROVIDER_SITE_OTHER): Payer: 59 | Admitting: Psychiatry

## 2021-12-05 ENCOUNTER — Telehealth: Payer: Self-pay | Admitting: Psychiatry

## 2021-12-05 VITALS — BP 166/92 | HR 86 | Ht 67.0 in | Wt 185.0 lb

## 2021-12-05 DIAGNOSIS — G43019 Migraine without aura, intractable, without status migrainosus: Secondary | ICD-10-CM

## 2021-12-05 DIAGNOSIS — H93A9 Pulsatile tinnitus, unspecified ear: Secondary | ICD-10-CM | POA: Diagnosis not present

## 2021-12-05 MED ORDER — UBRELVY 50 MG PO TABS: 50.0000 mg | ORAL_TABLET | ORAL | 6 refills | Status: DC | PRN

## 2021-12-05 NOTE — Patient Instructions (Signed)
Start Lauren Mullins as needed for migraines CT scan of the temporal bones

## 2021-12-05 NOTE — Progress Notes (Signed)
   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 Ophthalmology exam in February 2023 was negative for papilledema.  Interval History: Headaches are better since her last visit. She recently started CPAP which has helped. She currently has one headache per week with associated photophobia and nausea. Nurtec helps but makes her drowsy so she can't always take it during the day. She continues to have whooshing in her right ear. Denies vision changes.   Headache days per month: 4 Headache free days per month: 26  Current Headache Regimen: Preventative: none Abortive: Nurtec 75 mg as needed   Prior Therapies                                  Diamox - cramping Dyazide 50 mg daily Topamax - side effects Ibuprofen Excedrin - heart racing Naratriptan - side effects Imitrex - side effects  Physical Exam:   Vital Signs: BP (!) 166/92   Pulse 86   Ht 5\' 7"  (1.702 m)   Wt 185 lb (83.9 kg)   BMI 28.98 kg/m  GENERAL:  well appearing, in no acute distress, alert  SKIN:  Color, texture, turgor normal. No rashes or lesions HEAD:  Normocephalic/atraumatic. RESP: normal respiratory effort MSK:  No gross joint deformities.   NEUROLOGICAL: Mental Status: Alert, oriented to person, place and time, Follows commands, and Speech fluent and appropriate. Cranial Nerves: PERRL, face symmetric, no dysarthria, hearing grossly intact Motor: moves all extremities equally Gait: normal-based.  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 Nurtec helps her headaches but makes her drowsy. Will try Ubrelvy for migraine rescue. Recurrence of IIH is unlikely given lack of papilledema on ophthalmology  exam and improvement of her headaches without treatment. Will order CT temporal bones to assess for dehiscence of semicircular canals as a cause for persistent pulsatile tinnitus. She was unable to tolerate Diamox or Topamax. Could consider LP if symptoms persist and no alternative cause for pulsatile tinnitus is found.  PLAN: -CT temporal bones -Rescue: Start Ubrelvy 50 mg PRN -Next steps: consider PRN diclofenac, consider LP if pulsatile tinnitus persists without clear cause   Follow-up: 6 months  I spent a total of 21 minutes on the date of the service. Headache education was done. Discussed treatment options including preventive and acute medications. Discussed medication side effects, adverse reactions and drug interactions. Written educational materials and patient instructions outlining all of the above were given.  Genia Harold, MD 12/05/21 11:19 AM

## 2021-12-05 NOTE — Telephone Encounter (Signed)
[employer] Josem Kaufmann: [id number] exp. 12/05/21-01/19/22 sent to GI [phone]

## 2021-12-06 ENCOUNTER — Telehealth: Payer: Self-pay

## 2021-12-06 NOTE — Telephone Encounter (Signed)
The request for 16 UBRELVY TAB 50MG  per month is denied. This decision is based on health plan criteria for UBRELVY TAB 50MG . More than 8 tablets per month is covered only if: The drug is used for acute treatment of migraine with one of the following: (1) You have more than four migraines per month each requiring more than one dose. (2) You have more than eight migraines per month. The information provided does not show that you meet the criteria listed above. *Please note: UBRELVY TAB 50MG  has been approved for up to 8 per month until 12/07/2022

## 2021-12-06 NOTE — Telephone Encounter (Signed)
PA submitted via CMM Key: [place] Your information has been sent to [facility]. Awaiting determination

## 2021-12-16 ENCOUNTER — Ambulatory Visit
Admission: RE | Admit: 2021-12-16 | Discharge: 2021-12-16 | Disposition: A | Discharge status: Home / Self Care | Payer: 59 | Source: Ambulatory Visit | Attending: Psychiatry | Admitting: Psychiatry

## 2021-12-16 DIAGNOSIS — H93A9 Pulsatile tinnitus, unspecified ear: Secondary | ICD-10-CM

## 2021-12-16 MED ORDER — IOPAMIDOL (ISOVUE-300) INJECTION 61%
75.0000 mL | Freq: Once | INTRAVENOUS | Status: AC | PRN
  Administered 2021-12-16: 75 mL via INTRAVENOUS

## 2021-12-21 ENCOUNTER — Encounter (INDEPENDENT_AMBULATORY_CARE_PROVIDER_SITE_OTHER): Payer: 59 | Admitting: Psychiatry

## 2021-12-21 ENCOUNTER — Other Ambulatory Visit: Payer: Self-pay

## 2021-12-21 DIAGNOSIS — G932 Benign intracranial hypertension: Secondary | ICD-10-CM | POA: Diagnosis not present

## 2021-12-21 MED ORDER — UBRELVY 50 MG PO TABS: 50.0000 mg | ORAL_TABLET | ORAL | 6 refills | Status: DC | PRN

## 2021-12-21 NOTE — Telephone Encounter (Signed)
Please advise 

## 2021-12-21 NOTE — Telephone Encounter (Signed)
Please see the MyChart message reply(ies) for my assessment and plan.    This patient gave consent for this Medical Advice Message and is aware that it may result in a bill to [employer], as well as the possibility of receiving a bill for a co-payment or deductible. They are an established patient, but are not seeking medical advice exclusively about a problem treated during an in person or video visit in the last seven days. I did not recommend an in person or video visit within seven days of my reply.    I spent a total of 5 minutes cumulative time within 7 days through [employer].  Ocie Doyne, MD  12/21/21 4:08 PM

## 2021-12-22 NOTE — Addendum Note (Signed)
Addended by: Ocie Doyne on: 12/22/2021 08:28 AM   Modules accepted: Orders

## 2022-01-05 ENCOUNTER — Ambulatory Visit
Admission: RE | Admit: 2022-01-05 | Discharge: 2022-01-05 | Disposition: A | Discharge status: Home / Self Care | Payer: 59 | Source: Ambulatory Visit | Attending: Obstetrics and Gynecology | Admitting: Obstetrics and Gynecology

## 2022-01-05 DIAGNOSIS — Z139 Encounter for screening, unspecified: Secondary | ICD-10-CM

## 2022-01-09 ENCOUNTER — Other Ambulatory Visit: Payer: Self-pay | Admitting: Obstetrics and Gynecology

## 2022-01-09 DIAGNOSIS — R928 Other abnormal and inconclusive findings on diagnostic imaging of breast: Secondary | ICD-10-CM

## 2022-01-21 ENCOUNTER — Other Ambulatory Visit: Payer: Self-pay | Admitting: Obstetrics and Gynecology

## 2022-01-21 ENCOUNTER — Ambulatory Visit
Admission: RE | Admit: 2022-01-21 | Discharge: 2022-01-21 | Disposition: A | Discharge status: Home / Self Care | Payer: 59 | Source: Ambulatory Visit | Attending: Obstetrics and Gynecology | Admitting: Obstetrics and Gynecology

## 2022-01-21 ENCOUNTER — Ambulatory Visit: Payer: 59

## 2022-01-21 DIAGNOSIS — R928 Other abnormal and inconclusive findings on diagnostic imaging of breast: Secondary | ICD-10-CM

## 2022-02-03 ENCOUNTER — Ambulatory Visit
Admission: RE | Admit: 2022-02-03 | Discharge: 2022-02-03 | Disposition: A | Discharge status: Home / Self Care | Payer: 59 | Source: Ambulatory Visit | Attending: Psychiatry | Admitting: Psychiatry

## 2022-02-03 VITALS — BP 143/86 | HR 76

## 2022-02-03 DIAGNOSIS — G932 Benign intracranial hypertension: Secondary | ICD-10-CM

## 2022-02-03 LAB — CSF CELL COUNT WITH DIFFERENTIAL
RBC Count, CSF: 1 cells/uL — ABNORMAL HIGH
TOTAL NUCLEATED CELL: 1 cells/uL (ref 0–5)

## 2022-02-03 LAB — PROTEIN, CSF: Total Protein, CSF: 58 mg/dL — ABNORMAL HIGH (ref 15–45)

## 2022-02-03 LAB — GLUCOSE, CSF: Glucose, CSF: 66 mg/dL (ref 40–80)

## 2022-02-03 NOTE — Discharge Instructions (Signed)

## 2022-02-07 ENCOUNTER — Emergency Department (HOSPITAL_BASED_OUTPATIENT_CLINIC_OR_DEPARTMENT_OTHER): Payer: 59 | Admitting: Radiology

## 2022-02-07 ENCOUNTER — Other Ambulatory Visit: Payer: Self-pay

## 2022-02-07 ENCOUNTER — Emergency Department (HOSPITAL_BASED_OUTPATIENT_CLINIC_OR_DEPARTMENT_OTHER): Payer: 59

## 2022-02-07 ENCOUNTER — Encounter (HOSPITAL_BASED_OUTPATIENT_CLINIC_OR_DEPARTMENT_OTHER): Payer: Self-pay | Admitting: Emergency Medicine

## 2022-02-07 ENCOUNTER — Encounter: Payer: Self-pay | Admitting: Psychiatry

## 2022-02-07 ENCOUNTER — Emergency Department (HOSPITAL_BASED_OUTPATIENT_CLINIC_OR_DEPARTMENT_OTHER)
Admission: EM | Admit: 2022-02-07 | Discharge: 2022-02-07 | Disposition: A | Discharge status: Home / Self Care | Payer: 59 | Attending: Emergency Medicine | Admitting: Emergency Medicine

## 2022-02-07 ENCOUNTER — Other Ambulatory Visit: Payer: Self-pay | Admitting: Psychiatry

## 2022-02-07 DIAGNOSIS — R0602 Shortness of breath: Secondary | ICD-10-CM | POA: Diagnosis not present

## 2022-02-07 DIAGNOSIS — R112 Nausea with vomiting, unspecified: Secondary | ICD-10-CM | POA: Insufficient documentation

## 2022-02-07 DIAGNOSIS — G971 Other reaction to spinal and lumbar puncture: Secondary | ICD-10-CM

## 2022-02-07 DIAGNOSIS — R0789 Other chest pain: Secondary | ICD-10-CM | POA: Insufficient documentation

## 2022-02-07 DIAGNOSIS — R5383 Other fatigue: Secondary | ICD-10-CM | POA: Insufficient documentation

## 2022-02-07 DIAGNOSIS — Z1152 Encounter for screening for COVID-19: Secondary | ICD-10-CM | POA: Insufficient documentation

## 2022-02-07 DIAGNOSIS — R079 Chest pain, unspecified: Secondary | ICD-10-CM

## 2022-02-07 HISTORY — DX: Gastro-esophageal reflux disease without esophagitis: K21.9

## 2022-02-07 LAB — CBC
HCT: 46.2 % — ABNORMAL HIGH (ref 36.0–46.0)
Hemoglobin: 15.4 g/dL — ABNORMAL HIGH (ref 12.0–15.0)
MCH: 27.6 pg (ref 26.0–34.0)
MCHC: 33.3 g/dL (ref 30.0–36.0)
MCV: 82.9 fL (ref 80.0–100.0)
Platelets: 166 10*3/uL (ref 150–400)
RBC: 5.57 MIL/uL — ABNORMAL HIGH (ref 3.87–5.11)
RDW: 15.8 % — ABNORMAL HIGH (ref 11.5–15.5)
WBC: 8.5 10*3/uL (ref 4.0–10.5)
nRBC: 0 % (ref 0.0–0.2)

## 2022-02-07 LAB — COMPREHENSIVE METABOLIC PANEL
ALT: 15 U/L (ref 0–44)
AST: 13 U/L — ABNORMAL LOW (ref 15–41)
Albumin: 5.1 g/dL — ABNORMAL HIGH (ref 3.5–5.0)
Alkaline Phosphatase: 66 U/L (ref 38–126)
Anion gap: 12 (ref 5–15)
BUN: 12 mg/dL (ref 6–20)
CO2: 25 mmol/L (ref 22–32)
Calcium: 11 mg/dL — ABNORMAL HIGH (ref 8.9–10.3)
Chloride: 100 mmol/L (ref 98–111)
Creatinine, Ser: 0.98 mg/dL (ref 0.44–1.00)
GFR, Estimated: >60 mL/min (ref >60)
Glucose, Bld: 112 mg/dL — ABNORMAL HIGH (ref 70–99)
Potassium: 3.8 mmol/L (ref 3.5–5.1)
Sodium: 137 mmol/L (ref 135–145)
Total Bilirubin: 1.2 mg/dL (ref 0.3–1.2)
Total Protein: 9 g/dL — ABNORMAL HIGH (ref 6.5–8.1)

## 2022-02-07 LAB — URINALYSIS, ROUTINE W REFLEX MICROSCOPIC
Bilirubin Urine: NEGATIVE
Glucose, UA: NEGATIVE mg/dL
Hgb urine dipstick: NEGATIVE
Ketones, ur: NEGATIVE mg/dL
Leukocytes,Ua: NEGATIVE
Nitrite: NEGATIVE
Protein, ur: NEGATIVE mg/dL
Specific Gravity, Urine: 1.011 (ref 1.005–1.030)
pH: 7 (ref 5.0–8.0)

## 2022-02-07 LAB — TROPONIN I (HIGH SENSITIVITY)
Troponin I (High Sensitivity): <2 ng/L (ref <18)
Troponin I (High Sensitivity): 2 ng/L (ref <18)

## 2022-02-07 LAB — MAGNESIUM: Magnesium: 2.1 mg/dL (ref 1.7–2.4)

## 2022-02-07 LAB — RESP PANEL BY RT-PCR (RSV, FLU A&B, COVID)  RVPGX2
Influenza A by PCR: NEGATIVE
Influenza B by PCR: NEGATIVE
Resp Syncytial Virus by PCR: NEGATIVE
SARS Coronavirus 2 by RT PCR: NEGATIVE

## 2022-02-07 LAB — PREGNANCY, URINE: Preg Test, Ur: NEGATIVE

## 2022-02-07 LAB — D-DIMER, QUANTITATIVE: D-Dimer, Quant: 1.25 ug/mL-FEU — ABNORMAL HIGH (ref 0.00–0.50)

## 2022-02-07 LAB — LIPASE, BLOOD: Lipase: 20 U/L (ref 11–51)

## 2022-02-07 MED ORDER — KETOROLAC TROMETHAMINE 15 MG/ML IJ SOLN
15.0000 mg | Freq: Once | INTRAMUSCULAR | Status: AC
  Administered 2022-02-07: 15 mg via INTRAVENOUS
  Filled 2022-02-07: qty 1

## 2022-02-07 MED ORDER — METOCLOPRAMIDE HCL 5 MG/ML IJ SOLN
10.0000 mg | Freq: Once | INTRAMUSCULAR | Status: AC
  Administered 2022-02-07: 10 mg via INTRAVENOUS
  Filled 2022-02-07: qty 2

## 2022-02-07 MED ORDER — DIPHENHYDRAMINE HCL 50 MG/ML IJ SOLN
25.0000 mg | Freq: Once | INTRAMUSCULAR | Status: AC
  Administered 2022-02-07: 25 mg via INTRAVENOUS
  Filled 2022-02-07: qty 1

## 2022-02-07 MED ORDER — ACETAZOLAMIDE 250 MG PO TABS: 250.0000 mg | ORAL_TABLET | Freq: Two times a day (BID) | ORAL | 1 refills | Status: DC

## 2022-02-07 MED ORDER — IOHEXOL 350 MG/ML SOLN
100.0000 mL | Freq: Once | INTRAVENOUS | Status: AC | PRN
  Administered 2022-02-07: 75 mL via INTRAVENOUS

## 2022-02-07 MED ORDER — ONDANSETRON HCL 8 MG PO TABS: 8.0000 mg | ORAL_TABLET | Freq: Three times a day (TID) | ORAL | 0 refills | Status: AC | PRN

## 2022-02-07 MED ORDER — IOHEXOL 350 MG/ML SOLN: 100.0000 mL | Freq: Once | INTRAVENOUS | Status: DC | PRN

## 2022-02-07 MED ORDER — SODIUM CHLORIDE 0.9 % IV BOLUS
1000.0000 mL | Freq: Once | INTRAVENOUS | Status: AC
  Administered 2022-02-07: 1000 mL via INTRAVENOUS

## 2022-02-07 MED ORDER — IOHEXOL 350 MG/ML SOLN
100.0000 mL | Freq: Once | INTRAVENOUS | Status: AC | PRN
  Administered 2022-02-07: 100 mL via INTRAVENOUS

## 2022-02-07 NOTE — Discharge Instructions (Addendum)
It is unclear today what is causing your symptoms, your lungs look good, and it does not appear that you have a clot in your lungs, or fluid around your heart.  Please follow-up with your neurologist, for further evaluation.  We also performed a head CT to further evaluate for possible clot in your brain, and there appears to be no thrombus, secondary to the LP.

## 2022-02-07 NOTE — ED Triage Notes (Signed)
Pt arrives to ED with c/o nausea, chest pains, and emesis that started yesterday and headache that started on 12/29 after lumbar puncture. She notes starting last night she began to develop SOB.

## 2022-02-07 NOTE — ED Notes (Signed)
Patient verbalizes understanding of discharge instructions. Opportunity for questioning and answers were provided. Patient discharged from ED.  °

## 2022-02-07 NOTE — ED Notes (Signed)
Pt aware of the need for a urine... Unable to currently urinate. 

## 2022-02-07 NOTE — ED Provider Notes (Signed)
[facility] EMERGENCY DEPT Provider Note   CSN: [id number] Arrival date & time: 02/07/22  1146     History  Chief Complaint  Patient presents with   Emesis   Nausea    Lauren Mullins is a 47 y.o. female, history of IIH, who presents to the ED secondary to chest discomfort, shortness of breath, nausea, vomiting that started yesterday.  She notes that she had a lumbar puncture on 12/29, it went fairly well, and she had a slight headache, but was overall feeling okay.  Yesterday she felt very ill, like her limbs were all very weak, and started having central chest pain, that improved after laying on her left side.  She also states she feels very short of breath, and is non-smoker, and denies any upper respiratory symptoms such as cough, fever, sore throat.  States she has had lumbar punctures before and never had these issues.     Home Medications Prior to Admission medications   Medication Sig Start Date End Date Taking? Authorizing Provider  acetaZOLAMIDE (DIAMOX) 250 MG tablet Take 1 tablet (250 mg total) by mouth 2 (two) times daily. 02/07/22   Genia Harold, MD  aspirin-acetaminophen-caffeine (EXCEDRIN MIGRAINE) [phone] MG tablet as needed.    [provider]  Cyanocobalamin 1000 MCG CAPS See admin instructions.    [provider]  esomeprazole (NEXIUM) 10 MG packet Take 10 mg by mouth daily before breakfast.    [provider]  ibuprofen (ADVIL,MOTRIN) 800 MG tablet Take 1 tablet (800 mg total) by mouth every 8 (eight) hours as needed for mild pain or moderate pain. 12/15/13   Clayton Bibles, PA-C  Magnesium 200 MG TABS 2 tablets with a meal    [provider]  ondansetron (ZOFRAN) 8 MG tablet Take 1 tablet (8 mg total) by mouth every 8 (eight) hours as needed for nausea or vomiting. 02/07/22   Genia Harold, MD  Ubrogepant (UBRELVY) 50 MG TABS Take 50 mg by mouth as needed (for migraine). May repeat a dose in 2 hours if headaches.  Max dose 2 pills in 24 hours 12/21/21   Genia Harold, MD      Allergies    Macrobid [nitrofurantoin], Penicillins, and Sulfa antibiotics    Review of Systems   Review of Systems  Respiratory:  Positive for shortness of breath. Negative for cough.   Cardiovascular:  Positive for chest pain. Negative for leg swelling.  Gastrointestinal:  Positive for vomiting.    Physical Exam Updated Vital Signs BP 125/76   Pulse 68   Temp (!) 97.5 F (36.4 C) (Oral)   Resp 19   Ht 5\' 7"  (1.702 m)   Wt 81.6 kg   SpO2 100%   BMI 28.19 kg/m  Physical Exam Vitals and nursing note reviewed.  Constitutional:      General: She is not in acute distress.    Appearance: She is well-developed.  HENT:     Head: Normocephalic and atraumatic.  Eyes:     Conjunctiva/sclera: Conjunctivae normal.  Cardiovascular:     Rate and Rhythm: Normal rate and regular rhythm.     Heart sounds: No murmur heard. Pulmonary:     Effort: Pulmonary effort is normal. No respiratory distress.     Breath sounds: Normal breath sounds.  Abdominal:     Palpations: Abdomen is soft.     Tenderness: There is no abdominal tenderness.  Musculoskeletal:        General: No swelling.     Cervical  back: Neck supple.  Skin:    General: Skin is warm and dry.     Capillary Refill: Capillary refill takes less than 2 seconds.  Neurological:     Mental Status: She is alert.  Psychiatric:        Mood and Affect: Mood normal.     ED Results / Procedures / Treatments   Labs (all labs ordered are listed, but only abnormal results are displayed) Labs Reviewed  COMPREHENSIVE METABOLIC PANEL - Abnormal; Notable for the following components:      Result Value   Glucose, Bld 112 (*)    Calcium 11.0 (*)    Total Protein 9.0 (*)    Albumin 5.1 (*)    AST 13 (*)    All other components within normal limits  CBC - Abnormal; Notable for the following components:   RBC 5.57 (*)    Hemoglobin 15.4 (*)    HCT 46.2 (*)    RDW 15.8  (*)    All other components within normal limits  D-DIMER, QUANTITATIVE - Abnormal; Notable for the following components:   D-Dimer, Quant 1.25 (*)    All other components within normal limits  RESP PANEL BY RT-PCR (RSV, FLU A&B, COVID)  RVPGX2  LIPASE, BLOOD  URINALYSIS, ROUTINE W REFLEX MICROSCOPIC  PREGNANCY, URINE  MAGNESIUM  TROPONIN I (HIGH SENSITIVITY)  TROPONIN I (HIGH SENSITIVITY)    EKG EKG Interpretation  Date/Time:  Tuesday February 07 2022 12:25:45 EST Ventricular Rate:  97 PR Interval:  160 QRS Duration: 86 QT Interval:  364 QTC Calculation: 462 R Axis:   245 Text Interpretation: Normal sinus rhythm Anterolateral infarct , age undetermined No significant change since last tracing When compared with ECG of 15-Dec-2013 17:25, PREVIOUS ECG IS PRESENT Confirmed by Gwyneth SproutPlunkett, Whitney ([id number]) on 02/07/2022 1:13:54 PM  Radiology CT VENOGRAM HEAD  Result Date: 02/07/2022 CLINICAL DATA:  Dural venous sinus thrombosis suspected EXAM: CT VENOGRAM HEAD TECHNIQUE: Venographic phase images of the brain were obtained following the administration of intravenous contrast. Multiplanar reformats and maximum intensity projections were generated. RADIATION DOSE REDUCTION: This exam was performed according to the departmental dose-optimization program which includes automated exposure control, adjustment of the mA and/or kV according to patient size and/or use of iterative reconstruction technique. CONTRAST:  100mL OMNIPAQUE IOHEXOL 350 MG/ML SOLN COMPARISON:  12/16/2021 CT temporal bones.  The FINDINGS: Brain: No evidence of acute infarct, hemorrhage, mass, mass effect, or midline shift. No hydrocephalus or extra-axial fluid collection. Partial empty sella. Vascular: No hyperdense vessel. Skull: Normal. Negative for fracture or focal lesion. Sinuses/Orbits: Clear paranasal sinuses. Mildly tortuous optic nerves. Otherwise unremarkable orbits. Other: The mastoid air cells are well aerated. Superior  sagittal sinus: Normal. Straight sinus: Normal. Inferior sagittal sinus, vein of Galen and internal cerebral veins: Normal. Transverse sinuses: Narrowing of the distal transverse sinuses near the transverse-sigmoid junction. Sigmoid sinuses: Patent. At the left transverse-sigmoid junction, there is a prominent arachnoid granulation, which appears unchanged compared to 12/16/2021. Visualized jugular veins: Normal IMPRESSION: 1. No evidence of dural venous sinus thrombosis. 2. Partial empty sella, mildly tortuous optic nerves, and narrowing of the distal transverse sinuses near the transverse-sigmoid junction, which are nonspecific but can be seen in the setting of idiopathic intracranial hypertension. Electronically Signed   By: Wiliam KeAlison  Vasan M.D.   On: 02/07/2022 16:37   CT Angio Chest PE W and/or Wo Contrast  Result Date: 02/07/2022 CLINICAL DATA:  Chest pain and shortness of breath. EXAM: CT ANGIOGRAPHY CHEST WITH CONTRAST  TECHNIQUE: Multidetector CT imaging of the chest was performed using the standard protocol during bolus administration of intravenous contrast. Multiplanar CT image reconstructions and MIPs were obtained to evaluate the vascular anatomy. RADIATION DOSE REDUCTION: This exam was performed according to the departmental dose-optimization program which includes automated exposure control, adjustment of the mA and/or kV according to patient size and/or use of iterative reconstruction technique. CONTRAST:  9mL OMNIPAQUE IOHEXOL 350 MG/ML SOLN COMPARISON:  Chest x-ray from same day. FINDINGS: Cardiovascular: Satisfactory opacification of the pulmonary arteries to the segmental level. No evidence of pulmonary embolism. Normal heart size. No pericardial effusion. No thoracic aortic aneurysm or dissection. Mediastinum/Nodes: No enlarged mediastinal, hilar, or axillary lymph nodes. Thyroid gland, trachea, and esophagus demonstrate no significant findings. Tinsleigh Slovacek to moderate hiatal hernia. Lungs/Pleura:  Lungs are clear. No pleural effusion or pneumothorax. Upper Abdomen: No acute abnormality. Musculoskeletal: No chest wall abnormality. No acute or significant osseous findings. Review of the MIP images confirms the above findings. IMPRESSION: 1. No evidence of pulmonary embolism or other acute intrathoracic process. 2. Lumir Demetriou to moderate hiatal hernia. Electronically Signed   By: Titus Dubin M.D.   On: 02/07/2022 15:02   DG Chest 2 View  Result Date: 02/07/2022 CLINICAL DATA:  Short of breath, chest pain EXAM: CHEST - 2 VIEW COMPARISON:  None Available. FINDINGS: Normal mediastinum and cardiac silhouette. Normal pulmonary vasculature. No evidence of effusion, infiltrate, or pneumothorax. No acute bony abnormality. IMPRESSION: No acute cardiopulmonary process. Electronically Signed   By: Suzy Bouchard M.D.   On: 02/07/2022 13:41    Procedures Procedures    Medications Ordered in ED Medications  iohexol (OMNIPAQUE) 350 MG/ML injection 100 mL (has no administration in time range)  ketorolac (TORADOL) 15 MG/ML injection 15 mg (15 mg Intravenous Given 02/07/22 1337)  sodium chloride 0.9 % bolus 1,000 mL (0 mLs Intravenous Stopped 02/07/22 1600)  metoCLOPramide (REGLAN) injection 10 mg (10 mg Intravenous Given 02/07/22 1337)  diphenhydrAMINE (BENADRYL) injection 25 mg (25 mg Intravenous Given 02/07/22 1337)  iohexol (OMNIPAQUE) 350 MG/ML injection 100 mL (75 mLs Intravenous Contrast Given 02/07/22 1448)  iohexol (OMNIPAQUE) 350 MG/ML injection 100 mL (100 mLs Intravenous Contrast Given 02/07/22 1612)    ED Course/ Medical Decision Making/ A&P                           Medical Decision Making Patient is a 47 year old female, here for chest pain, shortness of breath, and headache that is been going on for since she had her lumbar puncture.  She states her headache is minimal, but she feels like her entire body is very heavy.  And she cannot breathe because her chest pain is so severe.  We will obtain labs,  troponins, D-dimer for further evaluation as well as a chest x-ray.  Amount and/or Complexity of Data Reviewed Labs: ordered.    Details: D-dimer elevated, we will obtain a CTA due to this.  Troponins within normal limits. Radiology: ordered.    Details: CTA unremarkable, CTV does not show any signs of dural venous sinus thrombosis ECG/medicine tests:  Decision-making details documented in ED Course. Discussion of management or test interpretation with external provider(s): Patient has no acute findings for her symptoms, CTV unremarkable no signs of dural venous sinus thrombosis.  She states her headache is minimal, and improved after headache cocktail.  CTA chest was unremarkable, her lungs are clear to auscultation, and she is satting at 99 to 100% on room  air.  We discussed importance of follow-up with her neurologist, and that she may need a blood patch, she voiced understanding and return precautions were emphasized.  She had no rubs, murmurs on exam.  No preceding illnesses prior to this.  No focal deficits.  Risk Prescription drug management.    Final Clinical Impression(s) / ED Diagnoses Final diagnoses:  Chest pain, unspecified type  Shortness of breath  Other fatigue    Rx / DC Orders ED Discharge Orders     None         Aayat Hajjar, Si Gaul, PA 02/07/22 1801    Blanchie Dessert, MD 02/12/22 1648

## 2022-02-08 ENCOUNTER — Ambulatory Visit
Admission: RE | Admit: 2022-02-08 | Discharge: 2022-02-08 | Disposition: A | Discharge status: Home / Self Care | Payer: 59 | Source: Ambulatory Visit | Attending: Psychiatry | Admitting: Psychiatry

## 2022-02-08 DIAGNOSIS — G971 Other reaction to spinal and lumbar puncture: Secondary | ICD-10-CM

## 2022-02-08 MED ORDER — ONDANSETRON HCL 4 MG PO TABS: 4.0000 mg | ORAL_TABLET | Freq: Once | ORAL | Status: DC

## 2022-02-08 MED ORDER — IOPAMIDOL (ISOVUE-M 200) INJECTION 41%
1.0000 mL | Freq: Once | INTRAMUSCULAR | Status: AC
  Administered 2022-02-08: 1 mL via EPIDURAL

## 2022-02-08 NOTE — Discharge Instr - Other Info (Signed)
13:38 Nauseous while on procedure table prior to blood patch, MD Derry verbal order for Zofran, see MAR.

## 2022-02-08 NOTE — Progress Notes (Signed)
20cc blood collected from pts L forearm by ALynnae Sandhoff RN prior to blood patch procedure. Gauze and tape applied after.

## 2022-02-08 NOTE — Discharge Instructions (Signed)

## 2022-02-09 ENCOUNTER — Encounter: Payer: Self-pay | Admitting: Neurology

## 2022-05-10 NOTE — Progress Notes (Unsigned)
   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 Ophthalmology exam in February 2023 was negative for papilledema.   At her last visit she was started on Ubrelvy for migraine rescue. CT temporal bones was ordered for persistent pulsatile tinnitus.  Interval History: CT temporal bones 12/16/21 showed bilateral TMJ and was otherwise unremarkable. She underwent LP 02/03/22 which revealed a borderline elevated opening pressure of 21. Had a post LP headache and underwent blood patach on 02/08/22.   CTV 02/07/22 showed a partially empty sella, mildly tortuous optic nerves, and narrowing of the distal transverse sinuses.  She was started on Diamox to see if this would relieve her symptoms***. Headaches***pulsatile tinnitus***vision***  Headache days per month: *** Migraine days per month*** Headache free days per month: ***  Current Headache Regimen: Preventative: *** Abortive: ***   Prior Therapies                                  Prevention: Diamox - cramping Dyazide 50 mg daily Topamax - side effects  Rescue: Ibuprofen Excedrin - heart racing Naratriptan - side effects Imitrex - side effects Nurtec 75 mg PRN  Physical Exam:   Vital Signs: There were no vitals taken for this visit. GENERAL:  well appearing, in no acute distress, alert  SKIN:  Color, texture, turgor normal. No rashes or lesions HEAD:  Normocephalic/atraumatic. RESP: normal respiratory effort MSK:  No gross joint deformities.   NEUROLOGICAL: Mental Status: Alert, oriented to person, place and time, Follows commands, and Speech fluent and appropriate. Cranial Nerves: PERRL, face symmetric, no dysarthria, hearing grossly intact Motor: moves all extremities equally Gait: normal-based.  IMPRESSION: ***  PLAN: ***   Follow-up: ***  I  spent a total of *** minutes on the date of the service. Headache education was done. Discussed lifestyle modification including increased oral hydration, decreased caffeine, exercise and stress management. Discussed treatment options including preventive and acute medications, natural supplements, and infusion therapy. Discussed medication overuse headache and to limit use of acute treatments to no more than 2 days/week or 10 days/month. Discussed medication side effects, adverse reactions and drug interactions. Written educational materials and patient instructions outlining all of the above were given.  Genia Harold, MD

## 2022-05-11 ENCOUNTER — Encounter: Payer: Self-pay | Admitting: Psychiatry

## 2022-05-11 ENCOUNTER — Ambulatory Visit (INDEPENDENT_AMBULATORY_CARE_PROVIDER_SITE_OTHER): Payer: 59 | Admitting: Psychiatry

## 2022-05-11 VITALS — BP 150/98 | HR 78 | Ht 67.0 in | Wt 181.5 lb

## 2022-05-11 DIAGNOSIS — G932 Benign intracranial hypertension: Secondary | ICD-10-CM | POA: Diagnosis not present

## 2022-05-11 DIAGNOSIS — G43019 Migraine without aura, intractable, without status migrainosus: Secondary | ICD-10-CM | POA: Diagnosis not present

## 2022-05-11 DIAGNOSIS — Z5181 Encounter for therapeutic drug level monitoring: Secondary | ICD-10-CM | POA: Diagnosis not present

## 2022-05-11 MED ORDER — ACETAZOLAMIDE 125 MG PO TABS: 125.0000 mg | ORAL_TABLET | Freq: Two times a day (BID) | ORAL | 6 refills | Status: AC

## 2022-05-11 NOTE — Patient Instructions (Addendum)
Can take potassium 99 mg daily to help with tingling in the fingers  Decrease Diamox to 125 mg twice a day

## 2022-05-12 LAB — COMPREHENSIVE METABOLIC PANEL
ALT: 21 IU/L (ref 0–32)
AST: 20 IU/L (ref 0–40)
Albumin/Globulin Ratio: 1.7 (ref 1.2–2.2)
Albumin: 4.8 g/dL (ref 3.9–4.9)
Alkaline Phosphatase: 71 IU/L (ref 44–121)
BUN/Creatinine Ratio: 12 (ref 9–23)
BUN: 12 mg/dL (ref 6–24)
Bilirubin Total: 0.8 mg/dL (ref 0.0–1.2)
CO2: 21 mmol/L (ref 20–29)
Calcium: 9.7 mg/dL (ref 8.7–10.2)
Chloride: 108 mmol/L — ABNORMAL HIGH (ref 96–106)
Creatinine, Ser: 0.97 mg/dL (ref 0.57–1.00)
Globulin, Total: 2.9 g/dL (ref 1.5–4.5)
Glucose: 94 mg/dL (ref 70–99)
Potassium: 4 mmol/L (ref 3.5–5.2)
Sodium: 142 mmol/L (ref 134–144)
Total Protein: 7.7 g/dL (ref 6.0–8.5)
eGFR: 73 mL/min/{1.73_m2} (ref >59)

## 2022-09-13 ENCOUNTER — Telehealth: Payer: Self-pay | Admitting: Psychiatry

## 2022-09-13 NOTE — Telephone Encounter (Signed)
LVM and sent mychart msg informing pt of need to reschedule 12/25/22 appt - MD leaving practice

## 2022-12-01 ENCOUNTER — Other Ambulatory Visit: Payer: Self-pay | Admitting: Obstetrics and Gynecology

## 2022-12-01 DIAGNOSIS — Z1231 Encounter for screening mammogram for malignant neoplasm of breast: Secondary | ICD-10-CM

## 2022-12-25 ENCOUNTER — Ambulatory Visit: Payer: 59 | Admitting: Psychiatry

## 2023-01-19 ENCOUNTER — Ambulatory Visit: Payer: 59

## 2023-02-09 ENCOUNTER — Ambulatory Visit
Admission: RE | Admit: 2023-02-09 | Discharge: 2023-02-09 | Disposition: A | Discharge status: Home / Self Care | Payer: BC Managed Care – PPO | Source: Ambulatory Visit | Attending: Obstetrics and Gynecology | Admitting: Obstetrics and Gynecology

## 2023-02-09 DIAGNOSIS — Z1231 Encounter for screening mammogram for malignant neoplasm of breast: Secondary | ICD-10-CM

## 2023-03-01 ENCOUNTER — Encounter: Payer: Self-pay | Admitting: Neurology

## 2023-03-01 ENCOUNTER — Ambulatory Visit: Payer: BC Managed Care – PPO | Admitting: Neurology

## 2023-03-01 VITALS — BP 126/88 | HR 72 | Ht 67.0 in | Wt 191.0 lb

## 2023-03-01 DIAGNOSIS — Z8669 Personal history of other diseases of the nervous system and sense organs: Secondary | ICD-10-CM | POA: Insufficient documentation

## 2023-03-01 DIAGNOSIS — H8111 Benign paroxysmal vertigo, right ear: Secondary | ICD-10-CM | POA: Diagnosis not present

## 2023-03-01 DIAGNOSIS — G43709 Chronic migraine without aura, not intractable, without status migrainosus: Secondary | ICD-10-CM

## 2023-03-01 MED ORDER — UBRELVY 50 MG PO TABS: 50.0000 mg | ORAL_TABLET | ORAL | 11 refills | Status: AC | PRN

## 2023-03-01 NOTE — Progress Notes (Signed)
Chief Complaint  Patient presents with   Follow-up    Pt in 14, here alone  Dr Delena Bali pt, pt is here for follow up on IIH. Pt states she has been doing well, states she stopped the Diamox because of side effects.     ASSESSMENT AND PLAN  Lauren Mullins is a 48 y.o. female   Chronic migraine  Ubrelvy as needed  History of idiopathic intracranial hypertension  Normal exam, no evidence of papillary edema, under the care of ophthalmology yearly, agree stop Diamox,  Benign positional vertigo  Right ear dependent position will trigger transient nystagmus, vertigo,  Refer to vestibular rehabilitation     DIAGNOSTIC DATA (LABS, IMAGING, TESTING) - I reviewed patient records, labs, notes, testing and imaging myself where available.   MEDICAL HISTORY:  Lauren Mullins is a 48 year old female, patient of Dr. Delena Bali, history of intracranial hypertension, chronic migraine, primary care is [facility] PA Isabella Bowens,     History is obtained from the patient and review of electronic medical records. I personally reviewed pertinent available imaging films in PACS.   PMHx of  HTN HLD GERD OSA-on CPAP since 2024.  She was first diagnosed with idiopathic intracranial hypertension at age 72 by Dr. Clarisse Gouge, presenting with frequent headaches, pulsatile tinnitus, denies visual change, did suffer some weight issues, had lumbar puncture which showed elevated opening pressure, she was treated with Diamox 500 mg daily at that time  She took Diamox for many years, followed up by ophthalmology regularly, there was no longer evidence of papillary edema, she was able to taper off Diamox.  Since 2023, she began to have frequent headaches, pulsatile tinnitus, dizziness again, couple times a week she has to take Excedrin Migraine, did help her headache,  Fluoroscopy guided lumbar puncture in December 2023 showed opening pressure of 21 cm water, she also developed post  LP headache required blood patch,  CT venogram in Jan 2024,  1. No evidence of dural venous sinus thrombosis. 2. Partial empty sella, mildly tortuous optic nerves, and narrowing of the distal transverse sinuses near the transverse-sigmoid junction, which are nonspecific but can be seen in the setting of idiopathic intracranial hypertension.  Trigger for her headaches are weather change, lack of sleep, certain food, stress, recently she started [place] as needed for abortive treatment works well,  Because increased frequency of the headache she started on Diamox 125 mg twice daily since August 2024, was not sure about its benefit  In addition, since summer 2024 she also developed dizziness, transient vertigo when lying flat, with sudden positional change,   PHYSICAL EXAM:   Vitals:   03/01/23 1022  BP: 126/88  Pulse: 72  Weight: 191 lb (86.6 kg)  Height: 5\' 7"  (1.702 m)     Body mass index is 29.91 kg/m.  PHYSICAL EXAMNIATION:  Gen: NAD, conversant, well nourised, well groomed                     Cardiovascular: Regular rate rhythm, no peripheral edema, warm, nontender. Eyes: Conjunctivae clear without exudates or hemorrhage Neck: Supple, no carotid bruits. Pulmonary: Clear to auscultation bilaterally   NEUROLOGICAL EXAM:  MENTAL STATUS: Speech/cognition: Awake, alert, oriented to history taking and casual conversation CRANIAL NERVES: CN II: Visual fields are full to confrontation. Pupils are round equal and briskly reactive to light.  Funduscopy examinations were normal CN III, IV, VI: extraocular movement are normal. No ptosis. CN V: Facial sensation is  intact to light touch CN VII: Face is symmetric with normal eye closure  CN VIII: Hearing is normal to causal conversation. CN IX, X: Phonation is normal. CN XI: Head turning and shoulder shrug are intact  MOTOR: There is no pronator drift of out-stretched arms. Muscle bulk and tone are normal. Muscle strength is  normal.  REFLEXES: Reflexes are 2+ and symmetric at the biceps, triceps, knees, and ankles. Plantar responses are flexor.  SENSORY: Intact to light touch, pinprick and vibratory sensation are intact in fingers and toes.  COORDINATION: There is no trunk or limb dysmetria noted.  GAIT/STANCE: Posture is normal. Gait is steady with normal steps, base, arm swing, and turning. Heel and toe walking are normal. Tandem gait is normal.  Romberg is absent.  Epley's maneuver: Right ear dependent position will trigger transient downward beating rotatory nystagmus, quickly habituate,  REVIEW OF SYSTEMS:  Full 14 system review of systems performed and notable only for as above All other review of systems were negative.   ALLERGIES: Allergies  Allergen Reactions   Macrobid [Nitrofurantoin] Hives    Other reaction(s): rash   Penicillins     rash   Sulfa Antibiotics     HOME MEDICATIONS: Current Outpatient Medications  Medication Sig Dispense Refill   ALPRAZolam (XANAX XR) 0.5 MG 24 hr tablet Take 0.5 mg by mouth daily. Prn     cetirizine (ZYRTEC) 10 MG tablet Take 10 mg by mouth daily.     esomeprazole (NEXIUM) 10 MG packet Take 10 mg by mouth daily before breakfast.     hydrochlorothiazide (HYDRODIURIL) 12.5 MG tablet Take 1 tablet every day by oral route for 30 days.     ibuprofen (ADVIL,MOTRIN) 800 MG tablet Take 1 tablet (800 mg total) by mouth every 8 (eight) hours as needed for mild pain or moderate pain. 15 tablet 0   Magnesium 200 MG TABS 2 tablets with a meal     ondansetron (ZOFRAN) 8 MG tablet Take 1 tablet (8 mg total) by mouth every 8 (eight) hours as needed for nausea or vomiting. 20 tablet 0   rosuvastatin (CRESTOR) 5 MG tablet Take 1 tablet by mouth daily.     Ubrogepant (UBRELVY) 50 MG TABS Take 50 mg by mouth as needed (for migraine). May repeat a dose in 2 hours if headaches. Max dose 2 pills in 24 hours 10 tablet 6   acetaZOLAMIDE (DIAMOX) 125 MG tablet Take 1 tablet  (125 mg total) by mouth 2 (two) times daily. (Patient not taking: Reported on 03/01/2023) 60 tablet 6   aspirin-acetaminophen-caffeine (EXCEDRIN MIGRAINE) 250-250-65 MG tablet as needed. (Patient not taking: Reported on 03/01/2023)     No current facility-administered medications for this visit.    PAST MEDICAL HISTORY: Past Medical History:  Diagnosis Date   GERD (gastroesophageal reflux disease)    Intracranial hypertension    Tinnitus     PAST SURGICAL HISTORY: Past Surgical History:  Procedure Laterality Date   BREAST BIOPSY Left 06/13/2019   BREAST BIOPSY Left 11/18/2015   CESAREAN SECTION     POLYPECTOMY     TONSILLECTOMY      FAMILY HISTORY: Family History  Problem Relation Age of Onset   Healthy Mother    Cancer Father    COPD Father    Breast cancer Maternal Aunt     SOCIAL HISTORY: Social History   Socioeconomic History   Marital status: Married    Spouse name: Brett Canales   Number of children: 3   Years  of education: Not on file   Highest education level: Some college, no degree  Occupational History    Comment: Print production planner, [employer]  Tobacco Use   Smoking status: Never   Smokeless tobacco: Never  Vaping Use   Vaping status: Never Used  Substance and Sexual Activity   Alcohol use: Yes    Alcohol/week: 1.0 standard drink of alcohol    Types: 1 Glasses of wine per week    Comment: occas   Drug use: No   Sexual activity: Not on file  Other Topics Concern   Not on file  Social History Narrative   Lives with family   Caffeine- coffee 1 c, soda 8 oz daily   Social Drivers of Corporate investment banker Strain: Not on file  Food Insecurity: Low Risk  (10/27/2022)   Received from [facility]   Hunger Vital Sign    Worried About Running Out of Food in the Last Year: Never true    Ran Out of Food in the Last Year: Never true  Transportation Needs: No Transportation Needs (10/27/2022)   Received from [facility]    In  the past 12 months, has lack of reliable transportation kept you from medical appointments, meetings, work or from getting things needed for daily living? : No  Physical Activity: Not on file  Stress: Not on file  Social Connections: Not on file  Intimate Partner Violence: Not on file      Levert Feinstein, M.D. Ph.D.  [facility] [address] [place], [place] [address] Ph: [phone] Fax: [phone]  CC:  Isabella Bowens, PA-C [address] [place],  [place] [address]  Resa Miner [place], [place]

## 2023-03-22 ENCOUNTER — Encounter (HOSPITAL_BASED_OUTPATIENT_CLINIC_OR_DEPARTMENT_OTHER): Payer: Self-pay | Admitting: Emergency Medicine

## 2023-03-22 ENCOUNTER — Other Ambulatory Visit: Payer: Self-pay

## 2023-03-22 ENCOUNTER — Emergency Department (HOSPITAL_BASED_OUTPATIENT_CLINIC_OR_DEPARTMENT_OTHER)
Admission: EM | Admit: 2023-03-22 | Discharge: 2023-03-22 | Disposition: A | Discharge status: Home / Self Care | Payer: BC Managed Care – PPO | Attending: Emergency Medicine | Admitting: Emergency Medicine

## 2023-03-22 ENCOUNTER — Encounter: Payer: Self-pay | Admitting: Neurology

## 2023-03-22 ENCOUNTER — Emergency Department (HOSPITAL_BASED_OUTPATIENT_CLINIC_OR_DEPARTMENT_OTHER): Payer: BC Managed Care – PPO

## 2023-03-22 ENCOUNTER — Emergency Department (HOSPITAL_BASED_OUTPATIENT_CLINIC_OR_DEPARTMENT_OTHER): Payer: BC Managed Care – PPO | Admitting: Radiology

## 2023-03-22 DIAGNOSIS — D696 Thrombocytopenia, unspecified: Secondary | ICD-10-CM | POA: Diagnosis not present

## 2023-03-22 DIAGNOSIS — R21 Rash and other nonspecific skin eruption: Secondary | ICD-10-CM | POA: Diagnosis present

## 2023-03-22 DIAGNOSIS — R519 Headache, unspecified: Secondary | ICD-10-CM | POA: Insufficient documentation

## 2023-03-22 DIAGNOSIS — I10 Essential (primary) hypertension: Secondary | ICD-10-CM | POA: Insufficient documentation

## 2023-03-22 DIAGNOSIS — M7989 Other specified soft tissue disorders: Secondary | ICD-10-CM | POA: Insufficient documentation

## 2023-03-22 DIAGNOSIS — Z7982 Long term (current) use of aspirin: Secondary | ICD-10-CM | POA: Diagnosis not present

## 2023-03-22 DIAGNOSIS — Z79899 Other long term (current) drug therapy: Secondary | ICD-10-CM | POA: Diagnosis not present

## 2023-03-22 DIAGNOSIS — R0789 Other chest pain: Secondary | ICD-10-CM | POA: Insufficient documentation

## 2023-03-22 HISTORY — DX: Essential (primary) hypertension: I10

## 2023-03-22 LAB — CBC WITH DIFFERENTIAL/PLATELET
Abs Immature Granulocytes: 0.08 10*3/uL — ABNORMAL HIGH (ref 0.00–0.07)
Basophils Absolute: 0.1 10*3/uL (ref 0.0–0.1)
Basophils Relative: 1 %
Eosinophils Absolute: 0.1 10*3/uL (ref 0.0–0.5)
Eosinophils Relative: 2 %
HCT: 37.5 % (ref 36.0–46.0)
Hemoglobin: 13.1 g/dL (ref 12.0–15.0)
Immature Granulocytes: 1 %
Lymphocytes Relative: 40 %
Lymphs Abs: 2.8 10*3/uL (ref 0.7–4.0)
MCH: 31.7 pg (ref 26.0–34.0)
MCHC: 34.9 g/dL (ref 30.0–36.0)
MCV: 90.8 fL (ref 80.0–100.0)
Monocytes Absolute: 0.4 10*3/uL (ref 0.1–1.0)
Monocytes Relative: 6 %
Neutro Abs: 3.6 10*3/uL (ref 1.7–7.7)
Neutrophils Relative %: 50 %
Platelets: 144 10*3/uL — ABNORMAL LOW (ref 150–400)
RBC: 4.13 MIL/uL (ref 3.87–5.11)
RDW: 13.1 % (ref 11.5–15.5)
WBC: 7.1 10*3/uL (ref 4.0–10.5)
nRBC: 0 % (ref 0.0–0.2)

## 2023-03-22 LAB — URINALYSIS, W/ REFLEX TO CULTURE (INFECTION SUSPECTED)
Bilirubin Urine: NEGATIVE
Glucose, UA: NEGATIVE mg/dL
Hgb urine dipstick: NEGATIVE
Ketones, ur: NEGATIVE mg/dL
Leukocytes,Ua: NEGATIVE
Nitrite: NEGATIVE
Protein, ur: NEGATIVE mg/dL
Specific Gravity, Urine: <1.005 — ABNORMAL LOW (ref 1.005–1.030)
pH: 7 (ref 5.0–8.0)

## 2023-03-22 LAB — COMPREHENSIVE METABOLIC PANEL
ALT: 32 U/L (ref 0–44)
AST: 30 U/L (ref 15–41)
Albumin: 4.5 g/dL (ref 3.5–5.0)
Alkaline Phosphatase: 52 U/L (ref 38–126)
Anion gap: 9 (ref 5–15)
BUN: 10 mg/dL (ref 6–20)
CO2: 27 mmol/L (ref 22–32)
Calcium: 9.7 mg/dL (ref 8.9–10.3)
Chloride: 102 mmol/L (ref 98–111)
Creatinine, Ser: 0.78 mg/dL (ref 0.44–1.00)
GFR, Estimated: >60 mL/min (ref >60)
Glucose, Bld: 80 mg/dL (ref 70–99)
Potassium: 3.4 mmol/L — ABNORMAL LOW (ref 3.5–5.1)
Sodium: 138 mmol/L (ref 135–145)
Total Bilirubin: 1.2 mg/dL (ref 0.0–1.2)
Total Protein: 7.9 g/dL (ref 6.5–8.1)

## 2023-03-22 LAB — TROPONIN I (HIGH SENSITIVITY): Troponin I (High Sensitivity): <2 ng/L (ref <18)

## 2023-03-22 LAB — BRAIN NATRIURETIC PEPTIDE: B Natriuretic Peptide: 49.7 pg/mL (ref 0.0–100.0)

## 2023-03-22 MED ORDER — DIPHENHYDRAMINE HCL 50 MG/ML IJ SOLN
25.0000 mg | Freq: Once | INTRAMUSCULAR | Status: AC
  Administered 2023-03-22: 25 mg via INTRAVENOUS
  Filled 2023-03-22: qty 1

## 2023-03-22 MED ORDER — METOCLOPRAMIDE HCL 5 MG/ML IJ SOLN
10.0000 mg | Freq: Once | INTRAMUSCULAR | Status: AC
  Administered 2023-03-22: 10 mg via INTRAVENOUS
  Filled 2023-03-22: qty 2

## 2023-03-22 MED ORDER — ACETAMINOPHEN 500 MG PO TABS
1000.0000 mg | ORAL_TABLET | Freq: Once | ORAL | Status: AC
  Administered 2023-03-22: 1000 mg via ORAL
  Filled 2023-03-22: qty 2

## 2023-03-22 NOTE — ED Notes (Signed)
Unable to obtain labs

## 2023-03-22 NOTE — ED Triage Notes (Signed)
Pt c/o rash x 3 weeks, bilateral UE and LE swelling. Also reports HA, intermittent shob, not currently

## 2023-03-22 NOTE — ED Provider Notes (Signed)
[place] EMERGENCY DEPARTMENT AT [facility] Provider Note   CSN: [id number] Arrival date & time: 03/22/23  1141     History  Chief Complaint  Patient presents with   Rash    Lauren Mullins is a 48 y.o. female.   Rash 48 year old female history of GERD, hypertension, IIH presenting for rash.  Patient says for last 3 weeks she has had rash.  Initially started on her hands, was slightly hyperpigmented and went up to her arms.  She saw her PCP.  She was taken hydrocortisone cream and then saw dermatology couple weeks ago who put her on Kenalog.  Rash of her hands has improved slightly.  However recently she developed rash on her ankles, this is worsened over the last couple days.  She returned from [place] on Monday.  Since then she has noticed left greater than right leg swelling as well as some intermittent chest tightness and shortness of breath.  None currently.  She also has mild headache.  No weakness or numbness or other neurologic changes.  No fevers or chills.  She is no history of blood clots or rheumatologic disease.  She is not sure what is causing all of her symptoms.  She is been using compression stockings.  She did take hydrochlorothiazide which has been on since December and she stopped it recently to see if this would help but has not made a significant change.  She is concerned for photosensitivity causing the rash.     Home Medications Prior to Admission medications   Medication Sig Start Date End Date Taking? Authorizing Provider  acetaZOLAMIDE (DIAMOX) 125 MG tablet Take 1 tablet (125 mg total) by mouth 2 (two) times daily. Patient not taking: Reported on 03/01/2023 05/11/22   Ocie Doyne, MD  ALPRAZolam (XANAX XR) 0.5 MG 24 hr tablet Take 0.5 mg by mouth daily. Prn    [provider]  aspirin-acetaminophen-caffeine (EXCEDRIN MIGRAINE) [phone] MG tablet as needed. Patient not taking: Reported on 03/01/2023    [provider]   cetirizine (ZYRTEC) 10 MG tablet Take 10 mg by mouth daily.    [provider]  esomeprazole (NEXIUM) 10 MG packet Take 10 mg by mouth daily before breakfast.    [provider]  hydrochlorothiazide (HYDRODIURIL) 12.5 MG tablet Take 1 tablet every day by oral route for 30 days. 01/25/23   [provider]  ibuprofen (ADVIL,MOTRIN) 800 MG tablet Take 1 tablet (800 mg total) by mouth every 8 (eight) hours as needed for mild pain or moderate pain. 12/15/13   Trixie Dredge, PA-C  Magnesium 200 MG TABS 2 tablets with a meal    [provider]  ondansetron (ZOFRAN) 8 MG tablet Take 1 tablet (8 mg total) by mouth every 8 (eight) hours as needed for nausea or vomiting. 02/07/22   Ocie Doyne, MD  rosuvastatin (CRESTOR) 5 MG tablet Take 1 tablet by mouth daily. 10/27/22   [provider]  Ubrogepant (UBRELVY) 50 MG TABS Take 1 tablet (50 mg total) by mouth as needed (for migraine). May repeat a dose in 2 hours if headaches. Max dose 2 pills in 24 hours 03/01/23   Levert Feinstein, MD      Allergies    Macrobid [nitrofurantoin], Penicillins, and Sulfa antibiotics    Review of Systems   Review of Systems  Skin:  Positive for rash.  Review of systems completed and notable as per HPI.  ROS otherwise negative.   Physical Exam Updated Vital Signs BP Marland Kitchen)  151/90   Pulse 78   Temp 97.7 F (36.5 C) (Oral)   Resp 15   Wt 86.6 kg   SpO2 100%   BMI 29.91 kg/m  Physical Exam Vitals and nursing note reviewed.  Constitutional:      General: She is not in acute distress.    Appearance: She is well-developed.  HENT:     Head: Normocephalic and atraumatic.     Nose: Nose normal.     Mouth/Throat:     Mouth: Mucous membranes are moist.     Pharynx: Oropharynx is clear.  Eyes:     Extraocular Movements: Extraocular movements intact.     Conjunctiva/sclera: Conjunctivae normal.     Pupils: Pupils are equal, round, and reactive to light.  Cardiovascular:     Rate  and Rhythm: Normal rate and regular rhythm.     Pulses: Normal pulses.     Heart sounds: Normal heart sounds. No murmur heard. Pulmonary:     Effort: Pulmonary effort is normal. No respiratory distress.     Breath sounds: Normal breath sounds.  Abdominal:     Palpations: Abdomen is soft.     Tenderness: There is no abdominal tenderness.  Musculoskeletal:        General: No swelling.     Cervical back: Neck supple.     Comments: Approximately 1+ edema to the left leg and calf.  No significant edema to the right.  2+ DP and PT pulses.  She has faint hyperpigmentation areas over the hands and wrists, with even more faint areas over the ankles.  She has no mucosal lesions.  No other rashes.  Skin:    General: Skin is warm and dry.     Capillary Refill: Capillary refill takes less than 2 seconds.  Neurological:     General: No focal deficit present.     Mental Status: She is alert and oriented to person, place, and time. Mental status is at baseline.     Cranial Nerves: No cranial nerve deficit.     Sensory: No sensory deficit.     Motor: No weakness.  Psychiatric:        Mood and Affect: Mood normal.     ED Results / Procedures / Treatments   Labs (all labs ordered are listed, but only abnormal results are displayed) Labs Reviewed  COMPREHENSIVE METABOLIC PANEL - Abnormal; Notable for the following components:      Result Value   Potassium 3.4 (*)    All other components within normal limits  CBC WITH DIFFERENTIAL/PLATELET - Abnormal; Notable for the following components:   Platelets 144 (*)    Abs Immature Granulocytes 0.08 (*)    All other components within normal limits  URINALYSIS, W/ REFLEX TO CULTURE (INFECTION SUSPECTED) - Abnormal; Notable for the following components:   Color, Urine COLORLESS (*)    Specific Gravity, Urine <1.005 (*)    Bacteria, UA RARE (*)    All other components within normal limits  BRAIN NATRIURETIC PEPTIDE  TROPONIN I (HIGH SENSITIVITY)     EKG EKG Interpretation Date/Time:  Thursday March 22 2023 15:25:48 EST Ventricular Rate:  68 PR Interval:  190 QRS Duration:  100 QT Interval:  436 QTC Calculation: 464 R Axis:   -37  Text Interpretation: Sinus rhythm Left axis deviation No significant change since last tracing Confirmed by Fulton Reek [phone]) on 03/22/2023 3:33:02 PM  Radiology US Venous Img Lower  Left (DVT Study) Result Date: 03/22/2023 CLINICAL DATA:  Left lower extremity edema EXAM: LEFT LOWER EXTREMITY VENOUS DOPPLER ULTRASOUND TECHNIQUE: Gray-scale sonography with compression, as well as color and duplex ultrasound, were performed to evaluate the deep venous system(s) from the level of the common femoral vein through the popliteal and proximal calf veins. COMPARISON:  None Available. FINDINGS: VENOUS Normal compressibility of the common femoral, superficial femoral, and popliteal veins, as well as the visualized calf veins. Visualized portions of profunda femoral vein and great saphenous vein unremarkable. No filling defects to suggest DVT on grayscale or color Doppler imaging. Doppler waveforms show normal direction of venous flow, normal respiratory plasticity and response to augmentation. Limited views of the contralateral common femoral vein are unremarkable. OTHER None. Limitations: none IMPRESSION: Negative. Electronically Signed   By: Malachy Moan M.D.   On: 03/22/2023 16:39   DG Chest 2 View Result Date: 03/22/2023 CLINICAL DATA:  Rash and extremity swelling. Intermittent shortness of breath. EXAM: CHEST - 2 VIEW COMPARISON:  CT chest and chest x-ray dated February 07, 2022. FINDINGS: The heart size and mediastinal contours are within normal limits. Both lungs are clear. The visualized skeletal structures are unremarkable. IMPRESSION: No active cardiopulmonary disease. Electronically Signed   By: Obie Dredge M.D.   On: 03/22/2023 14:52    Procedures Procedures    Medications Ordered in  ED Medications  metoCLOPramide (REGLAN) injection 10 mg (10 mg Intravenous Given 03/22/23 1550)  acetaminophen (TYLENOL) tablet 1,000 mg (1,000 mg Oral Given 03/22/23 1548)  diphenhydrAMINE (BENADRYL) injection 25 mg (25 mg Intravenous Given 03/22/23 1548)    ED Course/ Medical Decision Making/ A&P                                 Medical Decision Making Amount and/or Complexity of Data Reviewed Labs: ordered. Radiology: ordered.  Risk OTC drugs. Prescription drug management.   Medical Decision Making:   Lauren Mullins is a 48 y.o. female who presented to the ED today with recent rash, leg swelling.  Vital signs reviewed noted for hypertension.  On exam she has faint hyperpigmented rash over her arms and some spots over her legs.  Appears to be improving with topical steroids.  She is here because she has had now left greater than right leg swelling with recent travel and some chest tightness.  On exam she has slight pitting edema to the left leg.  Will obtain ultrasound evaluate for DVT.  Chest x-ray is unremarkable.  Will obtain lab workup as well.  I wonder if she could have immunologic or autoimmune cause of her symptoms.  She has no systemic signs of infection.  She does have mild headache but no neurologic changes, I do not think she needs emergent neuroimaging I have low suspicion for CNS infection.  We will treat her headache and obtain lab workup.   Patient placed on continuous vitals and telemetry monitoring while in ED which was reviewed periodically.  Reviewed and confirmed nursing documentation for past medical history, family history, social history.  Reassessment and Plan:   On reassessment patient is stable.  Headache is improved.  Workup is reassuring.  Troponin negative x 2, not consistent with ACS.  BNP is normal, no signs of heart failure.  Mild thrombocytopenia which she is aware of, has had this before.  No signs of bleeding.  DVT study without evidence of DVT.   Etiology of her rash and symptoms is not entirely clear.  I recommend  she follow close with her primary care doctor and dermatology and I placed referral for rheumatology which patient requests, I think there could be an autoimmune component to this potentially.  I gave her strict return precautions.  She is comfortable with this plan.   Patient's presentation is most consistent with acute complicated illness / injury requiring diagnostic workup.           Final Clinical Impression(s) / ED Diagnoses Final diagnoses:  Rash    Rx / DC Orders ED Discharge Orders          Ordered    Ambulatory referral to Rheumatology        03/22/23 1739              Laurence Spates, MD 03/22/23 [phone]

## 2023-03-22 NOTE — Discharge Instructions (Signed)
Your workup today was reassuring.  It is not entirely clear what is causing your rash.  I recommend you follow-up with your dermatologist and primary care doctor.  I placed a referral to rheumatology as well.

## 2023-03-22 NOTE — ED Provider Triage Note (Signed)
Emergency Medicine Provider Triage Evaluation Note  Lauren Mullins , a 48 y.o. female  was evaluated in triage.  Pt complains of BLE swelling, spreading of rash.  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 Placed on kenalog and Klobestaol cream with worsening spread of rash. Since then, derm scheduled bx 04/05/23 and offered rheumatology consult. Rash initially started on hand and now spreading up arm and on ankles with BUE swelling.   Recently on hydrochlorothiazide for HTN. Stopped taking it for possible worsening of rash from that. Has been off for three days and BLE swelling worsened  Had one episode of CP, SOB at 1030 and lasted an hour. Took 1/2 of xanax and resolved. No current CP, SOB  Review of Systems  Positive: BLE swelling, spreading of rash, CP, sob Negative: fever  Physical Exam  BP (!) 179/86 (BP Location: Right Arm)   Pulse 72   Temp 98.2 F (36.8 C)   Resp 16   Wt 86.6 kg   SpO2 100%   BMI 29.91 kg/m  Gen:   Awake, no distress   Resp:  Normal effort  MSK:   Moves extremities without difficulty  Other:  Non pruritic rash on BUE. Mild swelling to knees bilaterally. Currently wearing compression socks  Medical Decision Making  Medically screening exam initiated at 2:23 PM.  Appropriate orders placed.  Lauren Mullins was informed that the remainder of the evaluation will be completed by another provider, this initial triage assessment does not replace that evaluation, and the importance of remaining in the ED until their evaluation is complete.  Workup started   Judithann Sheen, [place] 03/22/23 1451

## 2023-03-22 NOTE — Therapy (Signed)
OUTPATIENT PHYSICAL THERAPY VESTIBULAR EVALUATION     Patient Name: Lauren Mullins MRN: [id number] DOB:12/09/1975, 48 y.o., female Today's Date: 03/23/2023  END OF SESSION:  PT End of Session - 03/23/23 1319     Visit Number 1    Number of Visits 5    Date for PT Re-Evaluation 04/22/23    Authorization Type BLUE CROSS BLUE SHIELD    PT Start Time 1316    PT Stop Time 1400    PT Time Calculation (min) 44 min    Activity Tolerance Patient tolerated treatment well    Behavior During Therapy WFL for tasks assessed/performed             Past Medical History:  Diagnosis Date   GERD (gastroesophageal reflux disease)    Hypertension    Intracranial hypertension    Tinnitus    Past Surgical History:  Procedure Laterality Date   BREAST BIOPSY Left 06/13/2019   BREAST BIOPSY Left 11/18/2015   CESAREAN SECTION     POLYPECTOMY     TONSILLECTOMY     Patient Active Problem List   Diagnosis Date Noted   Chronic migraine w/o aura w/o status migrainosus, not intractable 03/01/2023   History of idiopathic intracranial hypertension 03/01/2023   Benign paroxysmal positional vertigo of right ear 03/01/2023    PCP: Isabella Bowens, PA-C  REFERRING PROVIDER: Levert Feinstein, MD  REFERRING DIAG: [phone] (ICD-10-CM) - History of idiopathic intracranial hypertension G43.709 (ICD-10-CM) - Chronic migraine w/o aura w/o status migrainosus, not intractable H81.11 (ICD-10-CM) - Benign paroxysmal positional vertigo of right ear  THERAPY DIAG:  BPPV (benign paroxysmal positional vertigo), right  Dizziness and giddiness  ONSET DATE: 03/01/2023  Rationale for Evaluation and Treatment: Rehabilitation  SUBJECTIVE:   SUBJECTIVE STATEMENT: Dr. Terrace Arabia was trying to re-create her vertigo and reports it was BPPV. Reports she did the Epley and re-created her dizziness and reports they did it a couple times. Feeling ok now. Especially on her R side is problematic when she lays down. Tried the  Epley on her own a couple times and not sure that she is doing it right. When she lays on her back, the room will spin for a little bit before it calms down. Bending down and looking up can also trigger. Notes this has been going on for over a year - started around July of 2023. Room spinning doesn't happen until she lays down. Gets dizzy when upright and changes position. Has to be careful when going down steps. Has to be placement with feet on steps due to depth perception. Sometimes not 100% where she is putting her foot and has to be careful. Pt reports currently having a rash that she is dealing with (went to the ER yesterday). Unsure what is causing it and is going to follow up with rheumatology. Currently off hydrochlorothiazide to see if this is what caused it   Pt accompanied by: self  PERTINENT HISTORY: PMH: intracranial hypertension, chronic migraine   Per Dr. Terrace Arabia: She was first diagnosed with idiopathic intracranial hypertension at age 21 by Dr. Clarisse Gouge, presenting with frequent headaches, pulsatile tinnitus, denies visual change, did suffer some weight issues, had lumbar puncture which showed elevated opening pressure, she was treated with Diamox 500 mg daily at that time   She took Diamox for many years, followed up by ophthalmology regularly, there was no longer evidence of papillary edema, she was able to taper off Diamox.  PAIN:  Are you having pain? No  Vitals:  03/23/23 1329 03/23/23 1331  BP: (!) 161/99 (!) 149/96  Pulse: 78 79     PRECAUTIONS: None  FALLS: Has patient fallen in last 6 months? No   PLOF: Independent and Vocation/Vocational requirements: Full time job, Best boy at [employer]   PATIENT GOALS: Wants to see if there is anything she can do for herself that can improve the symptoms. Wants to make sure she is doing the Epley maneuver right  OBJECTIVE:  Note: Objective measures were completed at Evaluation unless otherwise  noted.  COGNITION: Overall cognitive status: Within functional limits for tasks assessed    Cervical ROM:   WFL    GAIT: Gait pattern: WFL Distance walked: Clinic distances  Assistive device utilized: None Level of assistance: Complete Independence  VESTIBULAR ASSESSMENT:  GENERAL OBSERVATION: Ambulates in independent with no AD.    SYMPTOM BEHAVIOR:  Subjective history: See above.   Non-Vestibular symptoms: headaches, nausea/vomiting, migraine symptoms, and has swishing in R ear, sometimes when having really having vertigo, will have some nausea    Type of dizziness: Spinning/Vertigo  Frequency: Daily, especially at night when lying down   Duration: usually a minute   Aggravating factors: Induced by position change: lying supine, rolling to the right, and supine to sit and Induced by motion: occur when walking, looking up at the ceiling, bending down to the ground, and turning body quickly  Relieving factors:  standing, getting head in a normal, being on her left side   Progression of symptoms: better, dizziness has gotten better, but room spinning has stayed the same   OCULOMOTOR EXAM:  Ocular Alignment: normal  Ocular ROM: No Limitations  Spontaneous Nystagmus: absent  Gaze-Induced Nystagmus: absent  Smooth Pursuits: intact  Saccades: intact  VESTIBULAR - OCULAR REFLEX:   Slow VOR: Normal  VOR Cancellation: Normal  Head-Impulse Test: HIT Right: positive (very slight) HIT Left: negative No dizziness, just has to keep blinking to re-focus again     POSITIONAL TESTING: Right Roll Test: no nystagmus and no dizziness  Left Roll Test: no nystagmus and very mild dizziness for just a second  Right Sidelying: no nystagmus and no dizziness with return to upright, when re-assessing a 2nd time, pt did demo brief R upbeating rotary nystagmus lasting 3-4 beats, very mild  Left Sidelying: no nystagmus and pt reporting some spinning dizziness and dizziness with return to upright    Pt lying supine - no nystagmus or dizziness                                                        TREATMENT DATE: 03/23/23  Habituation:  Brandt-Daroff: comment: Performed 2 reps each side, pt with no symptoms on L side, when performing 1st rep on R side, pt with R upbeating rotary nystagmus on lasting a couple seconds. Pt with incr dizziness with return to upright from R side and had to brace onto mat table and had to focus on an object for dizziness to calm down.      PATIENT EDUCATION: Education details: Clinical findings, POC (pt wants to schedule for in a couple weeks until her rash is figured out), BPPV education and discussed that since BP is elevated (especially diastolic) not safe as this time to put in dependent DixHallpike/Epley position and will wait to perform until BP is  lower, gave pt Austin Miles exercises to perform at home in the meantime for mild BPPV. Person educated: Patient Education method: Explanation, Demonstration, Verbal cues, and Handouts Education comprehension: verbalized understanding and returned demonstration  HOME EXERCISE PROGRAM: Access Code: [email or web address] URL: [email or web address] Date: 03/23/2023 Prepared by: Sherlie Ban  Exercises - Brandt-Daroff Vestibular Exercise  - 2 x daily - 5 x weekly - 3-4 reps  GOALS: Goals reviewed with patient? Yes  SHORT TERM GOALS ALL STGS = LTGS  LONG TERM GOALS: Target date: 04/20/2023  Pt will demo negative R posterior canal BPPV in order to demo improved dizziness. Baseline: (+) R posterior canalithiasis  Goal status: INITIAL   ASSESSMENT:  CLINICAL IMPRESSION: Patient is a 48 year old female referred to Neuro OPPT for BPPV.   Pt's PMH is significant for: intracranial hypertension, chronic migraine. The following deficits were present during the exam: dizziness, very slight positive R HIT test, R upbeating rotary nystagmus for a few beats in R sidelying position. Unable to safely perform  DixHallpike/Epley today due to pt's elevated diastolic BP. Gave pt Austin Miles exercises that she can perform at home. Pt is following up with her PCP regarding her BP and also seeing providers about her rash, so would like to be scheduled back in a couple weeks after other medical concerns are addressed. Pt would benefit from skilled PT to address these impairments and functional limitations to maximize functional mobility independence and decr dizziness.    OBJECTIVE IMPAIRMENTS: decreased balance and dizziness.   ACTIVITY LIMITATIONS: bending, bed mobility, reach over head, and locomotion level  PARTICIPATION LIMITATIONS: community activity  PERSONAL FACTORS: Behavior pattern, Past/current experiences, Time since onset of injury/illness/exacerbation, and 1-2 comorbidities: intracranial hypertension, chronic migraine   are also affecting patient's functional outcome.   REHAB POTENTIAL: Good  CLINICAL DECISION MAKING: Stable/uncomplicated  EVALUATION COMPLEXITY: Low   PLAN:  PT FREQUENCY: 1-2x/week  PT DURATION: 4 weeks  PLANNED INTERVENTIONS: 97164- PT Re-evaluation, 97110-Therapeutic exercises, 97530- Therapeutic activity, 97112- Neuromuscular re-education, 97535- Self Care, 74259- Manual therapy, [phone]- Canalith repositioning, Patient/Family education, Balance training, and Vestibular training  PLAN FOR NEXT SESSION: Check BP, re-check positional testing, perform R Epley if BPPV is under control or try Semont    Drake Leach, PT, DPT 03/23/2023, 2:11 PM

## 2023-03-23 ENCOUNTER — Ambulatory Visit: Payer: BC Managed Care – PPO | Attending: Neurology | Admitting: Physical Therapy

## 2023-03-23 VITALS — BP 149/96 | HR 79

## 2023-03-23 DIAGNOSIS — R42 Dizziness and giddiness: Secondary | ICD-10-CM | POA: Insufficient documentation

## 2023-03-23 DIAGNOSIS — H8111 Benign paroxysmal vertigo, right ear: Secondary | ICD-10-CM | POA: Diagnosis present

## 2023-03-23 DIAGNOSIS — G43709 Chronic migraine without aura, not intractable, without status migrainosus: Secondary | ICD-10-CM | POA: Diagnosis not present

## 2023-03-23 DIAGNOSIS — Z8669 Personal history of other diseases of the nervous system and sense organs: Secondary | ICD-10-CM | POA: Diagnosis not present

## 2023-03-27 ENCOUNTER — Telehealth: Payer: Self-pay

## 2023-03-27 NOTE — Telephone Encounter (Signed)
 Please obtain PA for Ubrelvy 50 mg tablets.

## 2023-03-28 ENCOUNTER — Telehealth: Payer: Self-pay

## 2023-03-28 ENCOUNTER — Other Ambulatory Visit (HOSPITAL_COMMUNITY): Payer: Self-pay

## 2023-03-28 NOTE — Telephone Encounter (Signed)
 PA request has been Submitted. New Encounter created for follow up. For additional info see Pharmacy Prior Auth telephone encounter from 03/28/2023.

## 2023-03-28 NOTE — Telephone Encounter (Signed)
 Pharmacy Patient Advocate Encounter  Received notification from [facility] that Prior Authorization for Lauren Mullins has been APPROVED from 01/25/2023 to 03/26/2024   Approval letter attached in patients media

## 2023-03-28 NOTE — Telephone Encounter (Signed)
 Pharmacy Patient Advocate Encounter   Received notification from RX Request Messages that prior authorization for Ubrelvy 50MG  tablets is required/requested.   Insurance verification completed.   The patient is insured through [facility] .   Per test claim: PA required; PA submitted to above mentioned insurance via CoverMyMeds Key/confirmation #/EOC [id number] Status is pending

## 2023-04-10 ENCOUNTER — Ambulatory Visit: Payer: BC Managed Care – PPO | Admitting: Physical Therapy

## 2023-04-12 ENCOUNTER — Encounter: Payer: Self-pay | Admitting: Physical Therapy

## 2023-04-12 ENCOUNTER — Ambulatory Visit: Payer: BC Managed Care – PPO | Admitting: Physical Therapy

## 2023-04-12 VITALS — BP 149/104 | HR 92

## 2023-04-12 DIAGNOSIS — G43709 Chronic migraine without aura, not intractable, without status migrainosus: Secondary | ICD-10-CM | POA: Insufficient documentation

## 2023-04-12 DIAGNOSIS — H8111 Benign paroxysmal vertigo, right ear: Secondary | ICD-10-CM

## 2023-04-12 DIAGNOSIS — R42 Dizziness and giddiness: Secondary | ICD-10-CM

## 2023-04-12 DIAGNOSIS — Z8669 Personal history of other diseases of the nervous system and sense organs: Secondary | ICD-10-CM | POA: Insufficient documentation

## 2023-04-12 NOTE — Therapy (Signed)
 OUTPATIENT PHYSICAL THERAPY VESTIBULAR TREATMENT - ARRIVED NO CHARGE     Patient Name: Lauren Mullins MRN: [id number] DOB:1975/07/17, 48 y.o., female Today's Date: 04/12/2023  END OF SESSION:  PT End of Session - 04/12/23 1618     Visit Number 2    Number of Visits 5    Date for PT Re-Evaluation 04/22/23    Authorization Type [employer]    PT Start Time 1617    PT Stop Time 1630   full time not used due to D/C visit   PT Time Calculation (min) 13 min    Activity Tolerance --   limited by elevated BP   Behavior During Therapy WFL for tasks assessed/performed             Past Medical History:  Diagnosis Date   GERD (gastroesophageal reflux disease)    Hypertension    Intracranial hypertension    Tinnitus    Past Surgical History:  Procedure Laterality Date   BREAST BIOPSY Left 06/13/2019   BREAST BIOPSY Left 11/18/2015   CESAREAN SECTION     POLYPECTOMY     TONSILLECTOMY     Patient Active Problem List   Diagnosis Date Noted   Chronic migraine w/o aura w/o status migrainosus, not intractable 03/01/2023   History of idiopathic intracranial hypertension 03/01/2023   Benign paroxysmal positional vertigo of right ear 03/01/2023    PCP: Isabella Bowens, PA-C  REFERRING PROVIDER: Levert Feinstein, MD  REFERRING DIAG: [phone] (ICD-10-CM) - History of idiopathic intracranial hypertension G43.709 (ICD-10-CM) - Chronic migraine w/o aura w/o status migrainosus, not intractable H81.11 (ICD-10-CM) - Benign paroxysmal positional vertigo of right ear  THERAPY DIAG:  BPPV (benign paroxysmal positional vertigo), right  Dizziness and giddiness  ONSET DATE: 03/01/2023  Rationale for Evaluation and Treatment: Rehabilitation  SUBJECTIVE:   SUBJECTIVE STATEMENT: Notes frequency of dizziness has been less and less. Has been able to lay on her R side. Has been working on her exercises. Still having the dizziness every once in a while. Mostly still having it when  laying down, but its getting less and less. Still trying to get everything resolved with her blood pressure. Had to cancel her previous appt due to bad migraine. Still being tested to see if her rash is auto immune related and just not feeling great.   Pt accompanied by: self  PERTINENT HISTORY: PMH: intracranial hypertension, chronic migraine   Per Dr. Terrace Arabia: She was first diagnosed with idiopathic intracranial hypertension at age 68 by Dr. Clarisse Gouge, presenting with frequent headaches, pulsatile tinnitus, denies visual change, did suffer some weight issues, had lumbar puncture which showed elevated opening pressure, she was treated with Diamox 500 mg daily at that time   She took Diamox for many years, followed up by ophthalmology regularly, there was no longer evidence of papillary edema, she was able to taper off Diamox.  PAIN:  Are you having pain? No  Vitals:   04/12/23 1623 04/12/23 1627  BP: (!) 144/102 (!) 149/104  Pulse: 94 92      PRECAUTIONS: None  FALLS: Has patient fallen in last 6 months? No   PLOF: Independent and Vocation/Vocational requirements: Full time job, Best boy at [employer]   PATIENT GOALS: Wants to see if there is anything she can do for herself that can improve the symptoms. Wants to make sure she is doing the Epley maneuver right  OBJECTIVE:  Note: Objective measures were completed at Evaluation unless otherwise noted.  COGNITION: Overall cognitive status: Within functional limits for tasks assessed    Cervical ROM:   WFL    GAIT: Gait pattern: WFL Distance walked: Clinic distances  Assistive device utilized: None Level of assistance: Complete Independence  VESTIBULAR ASSESSMENT:  GENERAL OBSERVATION: Ambulates in independent with no AD.    SYMPTOM BEHAVIOR:  Subjective history: See above.   Non-Vestibular symptoms: headaches, nausea/vomiting, migraine symptoms, and has swishing in R ear, sometimes when having  really having vertigo, will have some nausea    Type of dizziness: Spinning/Vertigo  Frequency: Daily, especially at night when lying down   Duration: usually a minute   Aggravating factors: Induced by position change: lying supine, rolling to the right, and supine to sit and Induced by motion: occur when walking, looking up at the ceiling, bending down to the ground, and turning body quickly  Relieving factors:  standing, getting head in a normal, being on her left side   Progression of symptoms: better, dizziness has gotten better, but room spinning has stayed the same   OCULOMOTOR EXAM:  Ocular Alignment: normal  Ocular ROM: No Limitations  Spontaneous Nystagmus: absent  Gaze-Induced Nystagmus: absent  Smooth Pursuits: intact  Saccades: intact  VESTIBULAR - OCULAR REFLEX:   Slow VOR: Normal  VOR Cancellation: Normal  Head-Impulse Test: HIT Right: positive (very slight) HIT Left: negative No dizziness, just has to keep blinking to re-focus again     POSITIONAL TESTING: Right Roll Test: no nystagmus and no dizziness  Left Roll Test: no nystagmus and very mild dizziness for just a second  Right Sidelying: no nystagmus and no dizziness with return to upright, when re-assessing a 2nd time, pt did demo brief R upbeating rotary nystagmus lasting 3-4 beats, very mild  Left Sidelying: no nystagmus and pt reporting some spinning dizziness and dizziness with return to upright   Pt lying supine - no nystagmus or dizziness                                                        TREATMENT DATE: 04/12/23  Vitals:   04/12/23 1623 04/12/23 1627  BP: (!) 144/102 (!) 149/104  Pulse: 94 92   Pt reporting that she has heart burn from lunch that makes her BP elevated.   Discussed with current BP resting is too high to safely participate in PT at this time. Pt has next appt scheduled on Tuesday. Pt reports that she has been performing [employer] a few times a week and they have been helping.  Educated to try to work on [employer] daily if possible and drink plenty of water.    PATIENT EDUCATION: Education details: See above.  Person educated: Patient Education method: Explanation Education comprehension: verbalized understanding  HOME EXERCISE PROGRAM: Access Code: [email or web address] URL: [email or web address] Date: 03/23/2023 Prepared by: Sherlie Ban  Exercises - Brandt-Daroff Vestibular Exercise  - 2 x daily - 5 x weekly - 3-4 reps  GOALS: Goals reviewed with patient? Yes  SHORT TERM GOALS ALL STGS = LTGS  LONG TERM GOALS: Target date: 04/20/2023  Pt will demo negative R posterior canal BPPV in order to demo improved dizziness. Baseline: (+) R posterior canalithiasis  Goal status: INITIAL   ASSESSMENT:  CLINICAL IMPRESSION: Arrived no charge due to elevated BP during session.    OBJECTIVE IMPAIRMENTS: decreased  balance and dizziness.   ACTIVITY LIMITATIONS: bending, bed mobility, reach over head, and locomotion level  PARTICIPATION LIMITATIONS: community activity  PERSONAL FACTORS: Behavior pattern, Past/current experiences, Time since onset of injury/illness/exacerbation, and 1-2 comorbidities: intracranial hypertension, chronic migraine   are also affecting patient's functional outcome.   REHAB POTENTIAL: Good  CLINICAL DECISION MAKING: Stable/uncomplicated  EVALUATION COMPLEXITY: Low   PLAN:  PT FREQUENCY: 1-2x/week  PT DURATION: 4 weeks  PLANNED INTERVENTIONS: 97164- PT Re-evaluation, 97110-Therapeutic exercises, 97530- Therapeutic activity, 97112- Neuromuscular re-education, 97535- Self Care, 96295- Manual therapy, [phone]- Canalith repositioning, Patient/Family education, Balance training, and Vestibular training  PLAN FOR NEXT SESSION: Check BP, re-check positional testing, perform R Epley if BPPV is under control or try Semont    Drake Leach, PT, DPT 04/12/2023, 4:36 PM

## 2023-04-17 ENCOUNTER — Ambulatory Visit: Payer: BC Managed Care – PPO | Admitting: Physical Therapy

## 2023-04-20 ENCOUNTER — Encounter: Payer: BC Managed Care – PPO | Admitting: Physical Therapy

## 2023-07-30 NOTE — Progress Notes (Signed)
Patient advised, labs ordered.

## 2023-08-15 NOTE — Progress Notes (Deleted)
 Office Visit Note  Patient: Lauren Mullins             Date of Birth: [birth year]           MRN: [id number]             PCP: Walke, Devyn Leeann, PA-C Referring: Nicholaus Cassondra DEL, MD Visit Date: 08/29/2023 Occupation: @GUAROCC @  Subjective:  No chief complaint on file.   History of Present Illness: Lauren Mullins is a 48 y.o. female ***   Patient presented due to emergency room with recent rash and leg swelling.  A faint hyperpigmented rash was noted over her arms and legs.  Which improved with topical steroids.  She was found to have bilateral lower extremity swelling more prominent on the right lower extremity.    Ultrasound was negative for DVT.  Activities of Daily Living:  Patient reports morning stiffness for *** {minute/hour:19697}.   Patient {ACTIONS;DENIES/REPORTS:21021675::Denies} nocturnal pain.  Difficulty dressing/grooming: {ACTIONS;DENIES/REPORTS:21021675::Denies} Difficulty climbing stairs: {ACTIONS;DENIES/REPORTS:21021675::Denies} Difficulty getting out of chair: {ACTIONS;DENIES/REPORTS:21021675::Denies} Difficulty using hands for taps, buttons, cutlery, and/or writing: {ACTIONS;DENIES/REPORTS:21021675::Denies}  No Rheumatology ROS completed.   PMFS History:  Patient Active Problem List   Diagnosis Date Noted   Chronic migraine w/o aura w/o status migrainosus, not intractable 03/01/2023   History of idiopathic intracranial hypertension 03/01/2023   Benign paroxysmal positional vertigo of right ear 03/01/2023    Past Medical History:  Diagnosis Date   GERD (gastroesophageal reflux disease)    Hypertension    Intracranial hypertension    Tinnitus     Family History  Problem Relation Age of Onset   Healthy Mother    Cancer Father    COPD Father    Breast cancer Maternal Aunt    Past Surgical History:  Procedure Laterality Date   BREAST BIOPSY Left 06/13/2019   BREAST BIOPSY Left 11/18/2015   CESAREAN SECTION     POLYPECTOMY      TONSILLECTOMY     Social History   Social History Narrative   Lives with family   Caffeine- coffee 1 c, soda 8 oz daily   Immunization History  Administered Date(s) Administered   PFIZER Comirnaty(Gray Top)Covid-19 Tri-Sucrose Vaccine 03/08/2020   PFIZER(Purple Top)SARS-COV-2 Vaccination 06/07/2019, 07/03/2019   Tdap 12/15/2013     Objective: Vital Signs: There were no vitals taken for this visit.   Physical Exam   Musculoskeletal Exam: ***  CDAI Exam: CDAI Score: -- Patient Global: --; Provider Global: -- Swollen: --; Tender: -- Joint Exam 08/29/2023   No joint exam has been documented for this visit   There is currently no information documented on the homunculus. Go to the Rheumatology activity and complete the homunculus joint exam.  Investigation: No additional findings.  Imaging: No results found.  Recent Labs: Lab Results  Component Value Date   WBC 7.1 03/22/2023   HGB 13.1 03/22/2023   PLT 144 (L) 03/22/2023   NA 138 03/22/2023   K 3.4 (L) 03/22/2023   CL 102 03/22/2023   CO2 27 03/22/2023   GLUCOSE 80 03/22/2023   BUN 10 03/22/2023   CREATININE 0.78 03/22/2023   BILITOT 1.2 03/22/2023   ALKPHOS 52 03/22/2023   AST 30 03/22/2023   ALT 32 03/22/2023   PROT 7.9 03/22/2023   ALBUMIN 4.5 03/22/2023   CALCIUM 9.7 03/22/2023   GFRAA  11/03/2008    >60        The eGFR has been calculated using the MDRD equation. This calculation has not been validated  in all clinical situations. eGFR's persistently <60 mL/min signify possible Chronic Kidney Disease.   July 27, 2023 B12 428, vitamin D 18.1, TSH 2.709, HDL 56, LDL 77, CBC WBC 7.4, hemoglobin 13.6, platelets 151 March 22, 2023 CMP normal  Speciality Comments: No specialty comments available.  Procedures:  No procedures performed Allergies: Macrobid [nitrofurantoin], Penicillins, and Sulfa antibiotics   Assessment / Plan:     Visit Diagnoses: No diagnosis found.  Orders: No orders of  the defined types were placed in this encounter.  No orders of the defined types were placed in this encounter.   Face-to-face time spent with patient was *** minutes. Greater than 50% of time was spent in counseling and coordination of care.  Follow-Up Instructions: No follow-ups on file.   Maya Nash, MD  Note - This record has been created using Animal nutritionist.  Chart creation errors have been sought, but may not always  have been located. Such creation errors do not reflect on  the standard of medical care.

## 2023-08-29 ENCOUNTER — Encounter: Payer: BC Managed Care – PPO | Admitting: Rheumatology

## 2023-08-29 DIAGNOSIS — R600 Localized edema: Secondary | ICD-10-CM

## 2023-08-29 DIAGNOSIS — Z8669 Personal history of other diseases of the nervous system and sense organs: Secondary | ICD-10-CM

## 2023-08-29 DIAGNOSIS — G43709 Chronic migraine without aura, not intractable, without status migrainosus: Secondary | ICD-10-CM

## 2023-08-29 DIAGNOSIS — H8111 Benign paroxysmal vertigo, right ear: Secondary | ICD-10-CM

## 2023-08-29 DIAGNOSIS — R21 Rash and other nonspecific skin eruption: Secondary | ICD-10-CM

## 2023-10-03 ENCOUNTER — Ambulatory Visit: Payer: BC Managed Care – PPO | Admitting: Rheumatology

## 2023-12-03 ENCOUNTER — Other Ambulatory Visit: Payer: Self-pay | Admitting: Obstetrics and Gynecology

## 2023-12-03 DIAGNOSIS — Z8249 Family history of ischemic heart disease and other diseases of the circulatory system: Secondary | ICD-10-CM

## 2023-12-07 ENCOUNTER — Ambulatory Visit
Admission: RE | Admit: 2023-12-07 | Discharge: 2023-12-07 | Disposition: A | Discharge status: Home / Self Care | Source: Ambulatory Visit | Attending: Obstetrics and Gynecology | Admitting: Obstetrics and Gynecology

## 2023-12-07 DIAGNOSIS — Z8249 Family history of ischemic heart disease and other diseases of the circulatory system: Secondary | ICD-10-CM

## 2023-12-26 ENCOUNTER — Other Ambulatory Visit: Payer: Self-pay | Admitting: Obstetrics and Gynecology

## 2023-12-26 DIAGNOSIS — Z1231 Encounter for screening mammogram for malignant neoplasm of breast: Secondary | ICD-10-CM

## 2024-01-09 ENCOUNTER — Encounter: Payer: Self-pay | Admitting: Neurology

## 2024-02-12 ENCOUNTER — Ambulatory Visit
Admission: RE | Admit: 2024-02-12 | Discharge: 2024-02-12 | Disposition: A | Payer: Self-pay | Source: Ambulatory Visit | Attending: Obstetrics and Gynecology | Admitting: Obstetrics and Gynecology

## 2024-02-12 DIAGNOSIS — Z1231 Encounter for screening mammogram for malignant neoplasm of breast: Secondary | ICD-10-CM
